# Patient Record
Sex: Male | Born: 1980 | Race: White | Hispanic: Yes | Marital: Single | Smoking: Never smoker
Health system: Southern US, Community
[De-identification: ages and names within clinical notes are randomized; demographics above are authoritative.]

## PROBLEM LIST (undated history)

## (undated) DIAGNOSIS — Z21 Asymptomatic human immunodeficiency virus [HIV] infection status: Secondary | ICD-10-CM

## (undated) DIAGNOSIS — B20 Human immunodeficiency virus [HIV] disease: Secondary | ICD-10-CM

## (undated) HISTORY — DX: Asymptomatic human immunodeficiency virus (hiv) infection status: Z21

## (undated) HISTORY — DX: Human immunodeficiency virus (HIV) disease: B20

## (undated) HISTORY — PX: HERNIA REPAIR: SHX51

---

## 2009-08-31 ENCOUNTER — Ambulatory Visit (HOSPITAL_COMMUNITY): Admission: RE | Admit: 2009-08-31 | Discharge: 2009-08-31 | Payer: Self-pay | Admitting: General Surgery

## 2010-04-24 LAB — CBC
Hemoglobin: 16.6 g/dL (ref 13.0–17.0)
MCH: 30.9 pg (ref 26.0–34.0)
MCHC: 35 g/dL (ref 30.0–36.0)
MCV: 88.2 fL (ref 78.0–100.0)

## 2010-04-24 LAB — BASIC METABOLIC PANEL
CO2: 28 mEq/L (ref 19–32)
Calcium: 10 mg/dL (ref 8.4–10.5)
Glucose, Bld: 94 mg/dL (ref 70–99)
Sodium: 140 mEq/L (ref 135–145)

## 2010-04-24 LAB — SURGICAL PCR SCREEN: Staphylococcus aureus: POSITIVE — AB

## 2011-05-03 ENCOUNTER — Other Ambulatory Visit (HOSPITAL_COMMUNITY): Payer: Self-pay | Admitting: Preventative Medicine

## 2011-05-03 ENCOUNTER — Ambulatory Visit (HOSPITAL_COMMUNITY)
Admission: RE | Admit: 2011-05-03 | Discharge: 2011-05-03 | Disposition: A | Discharge status: Home / Self Care | Payer: Self-pay | Source: Ambulatory Visit | Attending: Preventative Medicine | Admitting: Preventative Medicine

## 2011-05-03 DIAGNOSIS — R1031 Right lower quadrant pain: Secondary | ICD-10-CM | POA: Insufficient documentation

## 2011-05-03 DIAGNOSIS — R10829 Rebound abdominal tenderness, unspecified site: Secondary | ICD-10-CM

## 2011-05-03 MED ORDER — IOHEXOL 300 MG/ML  SOLN
100.0000 mL | Freq: Once | INTRAMUSCULAR | Status: AC | PRN
  Administered 2011-05-03: 100 mL via INTRAVENOUS

## 2013-03-10 IMAGING — CT CT ABD-PELV W/ CM
2 of 4 series · 17 of 46 positions shown, 19 images · IV contrast (omnipaque)
Comparison: None

CLINICAL DATA: Right lower quadrant pain.

CT ABDOMEN AND PELVIS WITH CONTRAST
TECHNIQUE: Multidetector CT imaging of the abdomen and pelvis was
performed following the standard protocol during bolus
administration of intravenous contrast.
Contrast:  100 ml Omnipaque 300 IV.

[Series 2: abd_pel_with 5.0 b40f · axial · 0.76mm/px · z∈[-488,-48]mm · 14 of 98 slices shown, 16 images]
[im 5/98  soft-tissue]
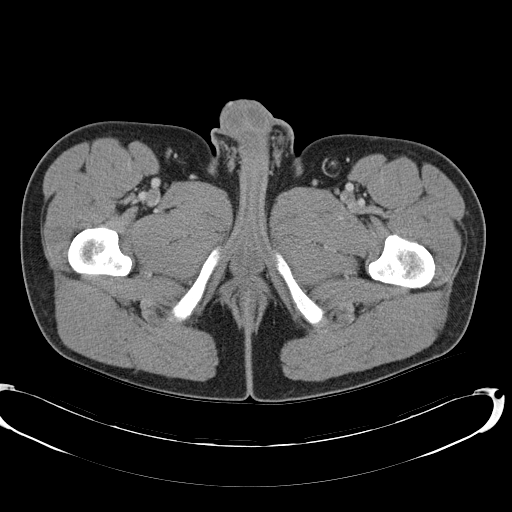
[im 5/98  bone]
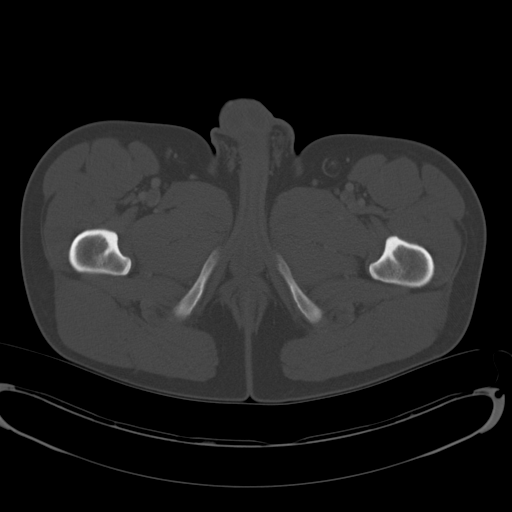
[im 13/98  soft-tissue]
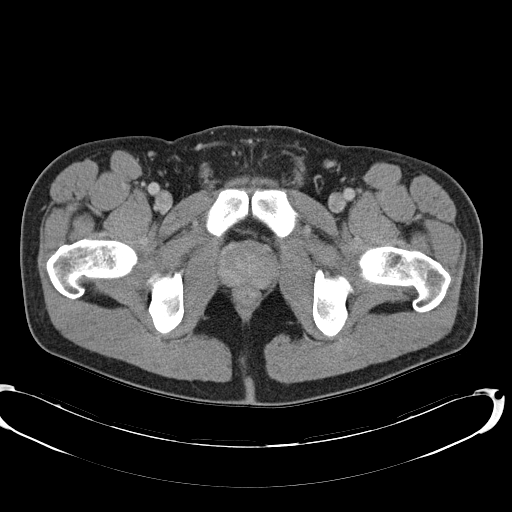
[im 21/98  soft-tissue]
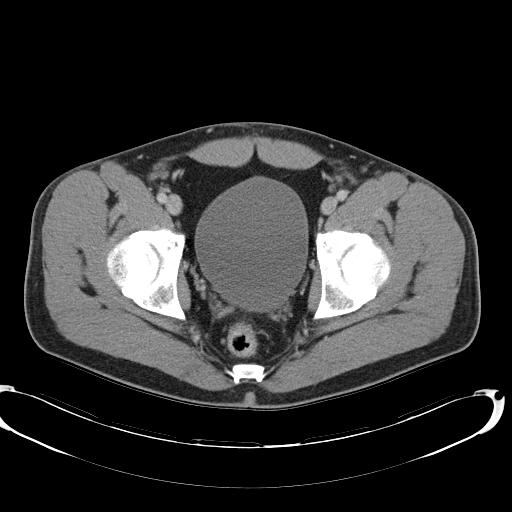
[im 25/98  soft-tissue]
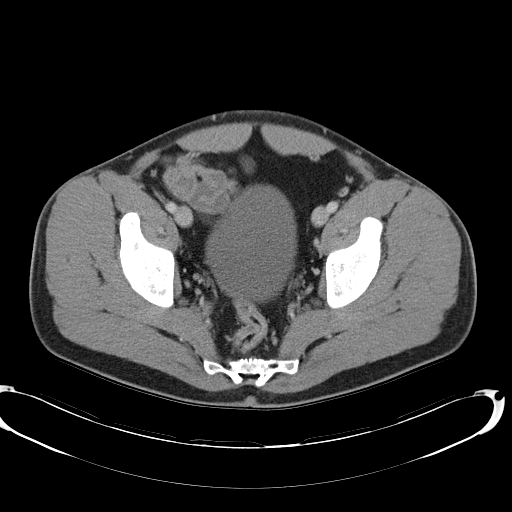
[im 33/98  soft-tissue]
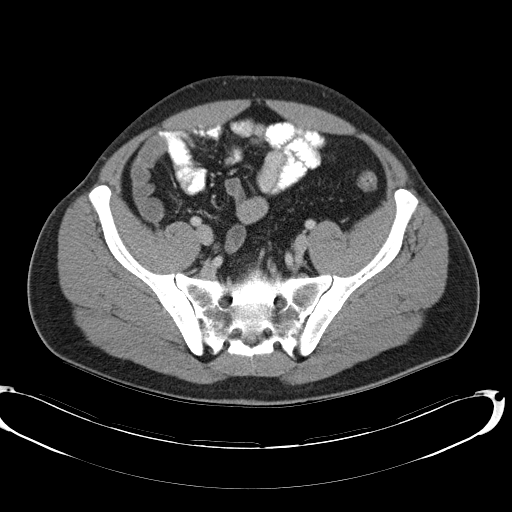
[im 41/98  soft-tissue]
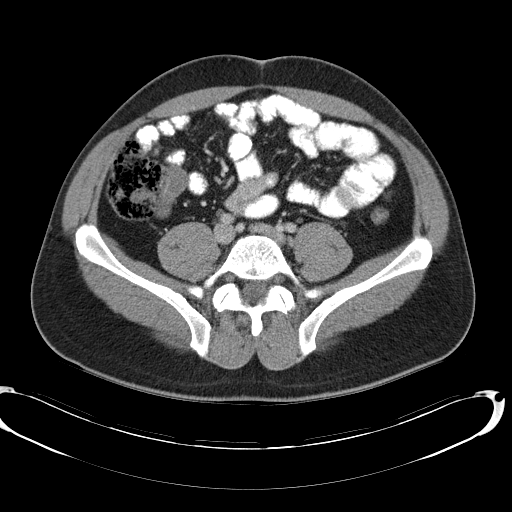
[im 45/98  soft-tissue]
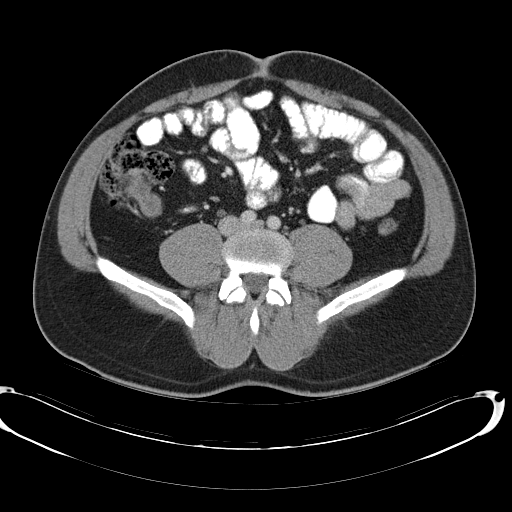
[im 53/98  soft-tissue]
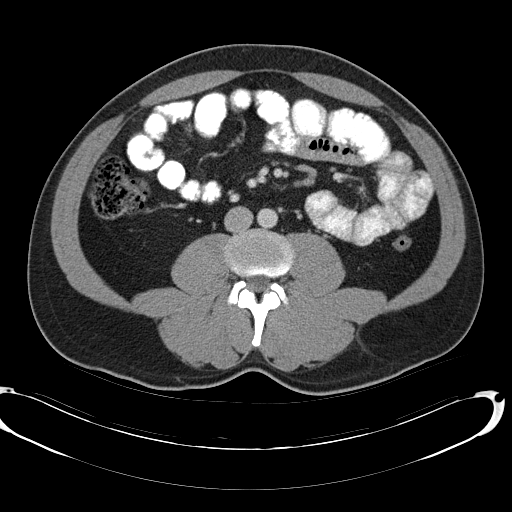
[im 57/98  soft-tissue]
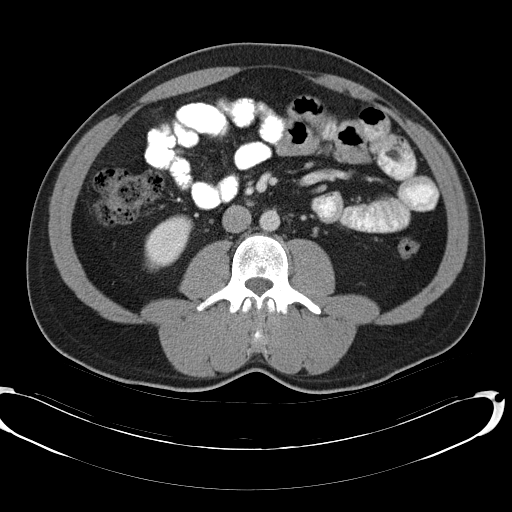
[im 57/98  bone]
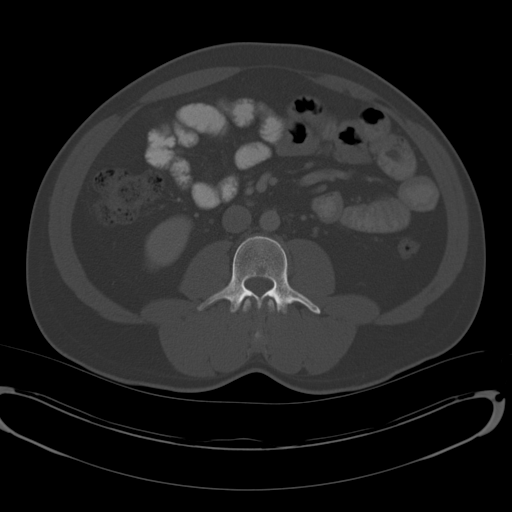
[im 65/98  soft-tissue]
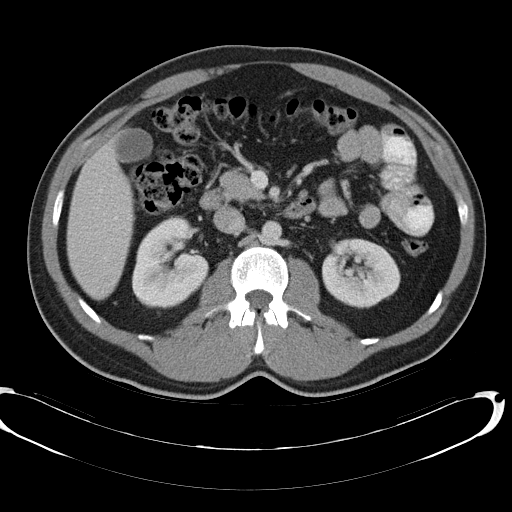
[im 73/98  soft-tissue]
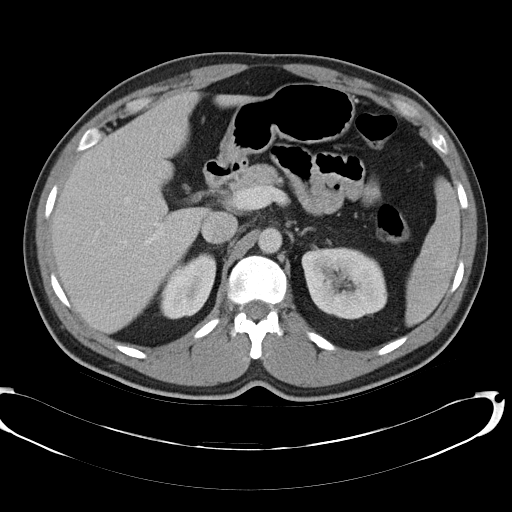
[im 77/98  soft-tissue]
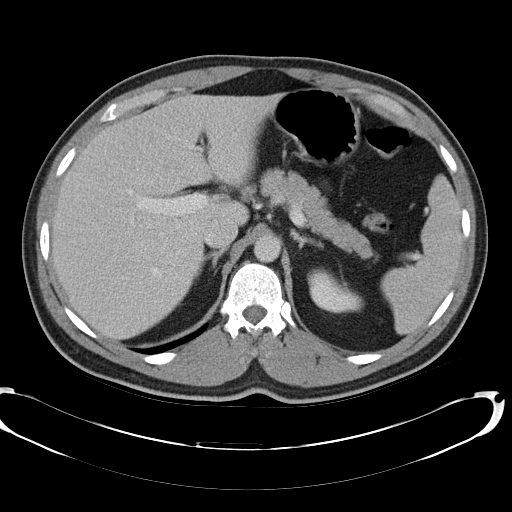
[im 85/98  soft-tissue]
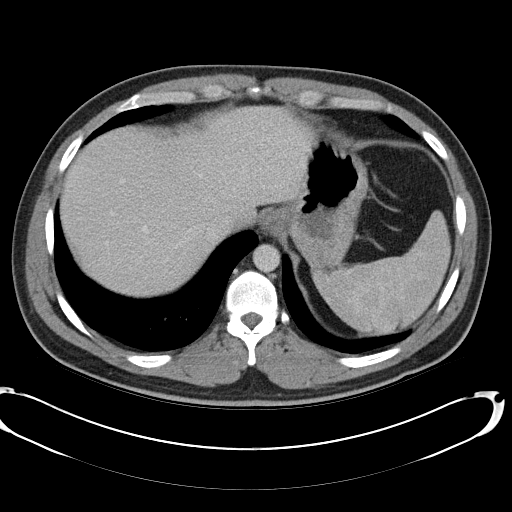
[im 93/98  soft-tissue]
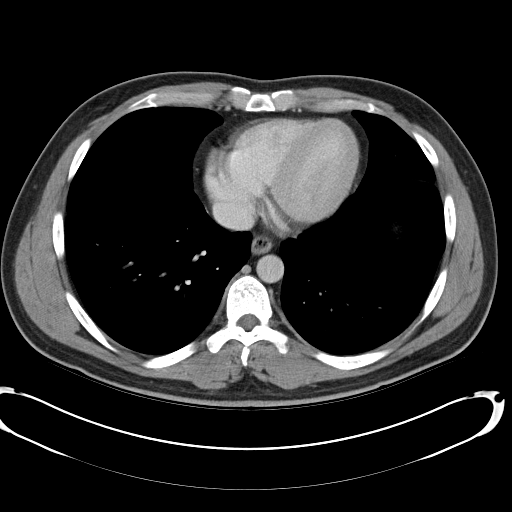

[Series 4: abd_pel_with 3.0 spo cor · coronal · 0.73mm/px · 3 of 89 slices shown]
[im 30/89  soft-tissue]
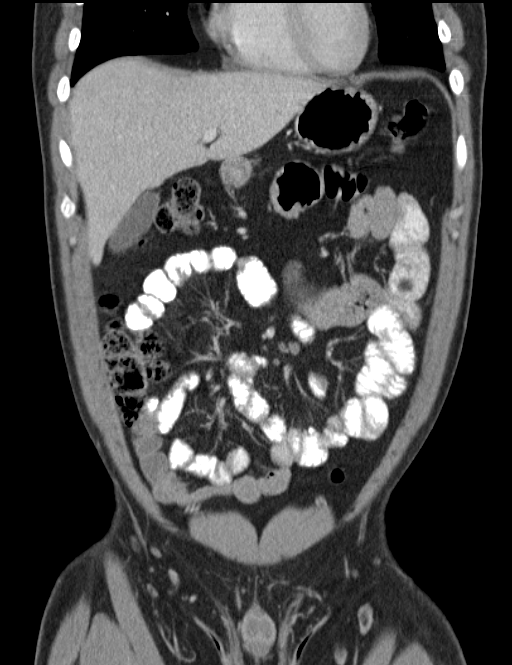
[im 40/89  soft-tissue]
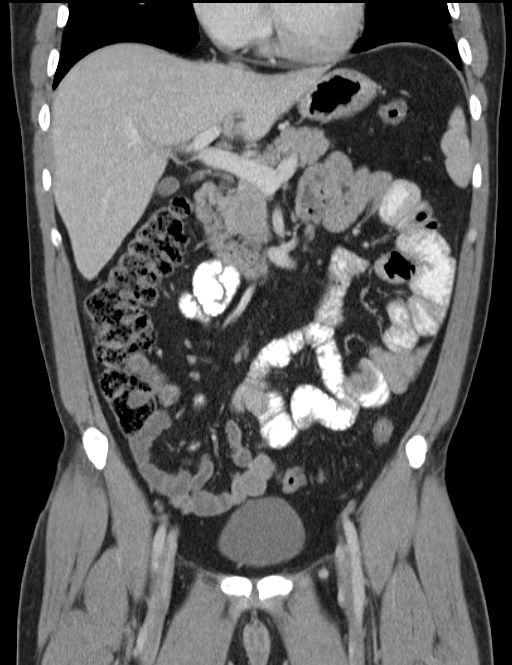
[im 49/89  soft-tissue]
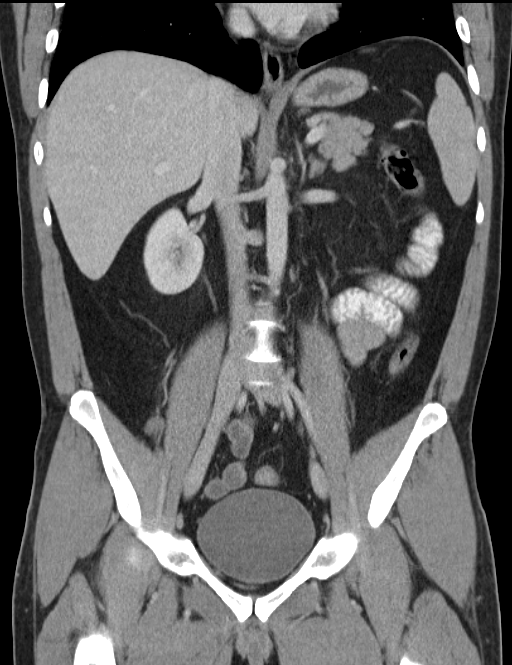

[17 of 46 positions shown; findings below may reference images not displayed]

FINDINGS: Lung bases are clear.  No effusions.  Heart is normal
size.

Liver, gallbladder, spleen, pancreas, adrenals and kidneys are
unremarkable.  Appendix is visualized and is normal. Bowel grossly
unremarkable.  No free fluid, free air, or adenopathy.  No acute
bony abnormality.  Scattered small mesenteric lymph nodes, none
pathologically enlarged.  Aorta is normal caliber.
IMPRESSION: Normal appendix.  No acute findings in the abdomen or pelvis.

## 2019-02-20 ENCOUNTER — Other Ambulatory Visit: Payer: Self-pay

## 2019-02-20 ENCOUNTER — Ambulatory Visit: Payer: Self-pay | Attending: Internal Medicine

## 2019-02-20 DIAGNOSIS — Z20822 Contact with and (suspected) exposure to covid-19: Secondary | ICD-10-CM

## 2019-02-20 DIAGNOSIS — U071 COVID-19: Secondary | ICD-10-CM | POA: Insufficient documentation

## 2019-02-21 LAB — NOVEL CORONAVIRUS, NAA: SARS-CoV-2, NAA: DETECTED — AB

## 2021-05-20 ENCOUNTER — Telehealth: Payer: Self-pay

## 2021-05-20 NOTE — Telephone Encounter (Signed)
Patient called wanting to schedule appointment. New B20 - referral coming from [facility]. Advised patient I would send a message to our referral coordinator and we'll be in contact with him to schedule appointment.  ? ? ?Clinton Baker, CMA ? ?

## 2021-05-26 ENCOUNTER — Other Ambulatory Visit (HOSPITAL_COMMUNITY): Payer: Self-pay

## 2021-05-26 ENCOUNTER — Telehealth: Payer: Self-pay

## 2021-05-26 NOTE — Telephone Encounter (Signed)
[facility] Patient Advocate Encounter ? ?Insurance verification completed.   ? ?The patient is insured through [employer]. ? ?Biktarvy $300.00 ?Dovato $0.00 ? ?We will continue to follow to see if copay assistance is needed. ? ?Ileene Patrick, CPhT ?Specialty Pharmacy Patient Advocate ?[facility] ?Phone: [phone] ?Fax:  [phone]  ?

## 2021-05-27 ENCOUNTER — Other Ambulatory Visit: Payer: Self-pay

## 2021-05-27 ENCOUNTER — Ambulatory Visit (INDEPENDENT_AMBULATORY_CARE_PROVIDER_SITE_OTHER): Payer: 59 | Admitting: Pharmacist

## 2021-05-27 ENCOUNTER — Other Ambulatory Visit: Payer: 59

## 2021-05-27 ENCOUNTER — Encounter: Payer: Self-pay | Admitting: Family

## 2021-05-27 ENCOUNTER — Ambulatory Visit (INDEPENDENT_AMBULATORY_CARE_PROVIDER_SITE_OTHER): Payer: 59 | Admitting: Family

## 2021-05-27 ENCOUNTER — Ambulatory Visit: Payer: 59

## 2021-05-27 VITALS — BP 147/96 | HR 75 | Temp 97.8°F | Wt 203.0 lb

## 2021-05-27 DIAGNOSIS — Z79899 Other long term (current) drug therapy: Secondary | ICD-10-CM

## 2021-05-27 DIAGNOSIS — Z113 Encounter for screening for infections with a predominantly sexual mode of transmission: Secondary | ICD-10-CM | POA: Diagnosis not present

## 2021-05-27 DIAGNOSIS — B20 Human immunodeficiency virus [HIV] disease: Secondary | ICD-10-CM

## 2021-05-27 MED ORDER — DOVATO 50-300 MG PO TABS: 1.0000 | ORAL_TABLET | Freq: Every day | ORAL | 3 refills | Status: DC

## 2021-05-27 NOTE — Patient Instructions (Addendum)
Nice to see you. ? ?We will check your lab work today. ? ?Start taking your medications.  ? ?Refills have been sent to the pharmacy. ? ?Plan for follow up in 1 months or sooner if needed with lab work on the same day. ? ?Have a great day and stay safe! ? ?

## 2021-05-27 NOTE — Assessment & Plan Note (Signed)
Mr. 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 Currently asymptomatic and treatment naive. We discussed the pathogenesis, risks if left untreated, transmission, treatment options, clinic resources and financial assistance programs. Check initial clinic lab work today. Start Dovato. Plan for follow up in 1 month or sooner if needed with lab work on the same day.  ?

## 2021-05-27 NOTE — Progress Notes (Signed)
? ?  05/27/2021 ? ?HPI: Clinton Baker is a 41 y.o. male who presents to the [facility] clinic today to initiate care for a newly diagnosed HIV infection. ? ?Patient Active Problem List  ? Diagnosis Date Noted  ? HIV disease ([place]) 05/27/2021  ? ? ?Patient's Medications  ? No medications on file  ? ? ?Allergies: ?No Known Allergies ? ?Past Medical History: ?Past Medical History:  ?Diagnosis Date  ? HIV infection ([place])   ? ? ?Social History: ?Social History  ? ?Socioeconomic History  ? Marital status: Single  ?  Spouse name: Not on file  ? Number of children: Not on file  ? Years of education: Not on file  ? Highest education level: Not on file  ?Occupational History  ? Not on file  ?Tobacco Use  ? Smoking status: Never  ?  Passive exposure: Never  ? Smokeless tobacco: Never  ?Vaping Use  ? Vaping Use: Never used  ?Substance and Sexual Activity  ? Alcohol use: Not on file  ?  Comment: Occasional  ? Drug use: Never  ? Sexual activity: Not on file  ?Other Topics Concern  ? Not on file  ?Social History Narrative  ? Not on file  ? ?Social Determinants of Health  ? ?Financial Resource Strain: Not on file  ?Food Insecurity: Not on file  ?Transportation Needs: Not on file  ?Physical Activity: Not on file  ?Stress: Not on file  ?Social Connections: Not on file  ? ? ?Labs: ?No results found for: HIV1RNAQUANT, HIV1RNAVL, CD4TABS ? ?RPR and STI ?No results found for: LABRPR, RPRTITER ? ?   ? View : No data to display.  ?  ?  ?  ? ? ?Hepatitis B ?No results found for: HEPBSAB, HEPBSAG, HEPBCAB ?Hepatitis C ?No results found for: [place], HCVRNAPCRQN ?Hepatitis A ?No results found for: HAV ?Lipids: ?No results found for: CHOL, TRIG, HDL, CHOLHDL, VLDL, LDLCALC ? ?Current HIV Regimen: ?Treatment naive ? ?Assessment: ?Clinton Baker is here today to initiate care with Marya Amsler for newly diagnosed HIV infection. Clinton Baker is treatment naive. Marya Amsler will start patient on Dovato. ? ?Plan: ?Explained that Dovato is a one pill once daily medication with  or without food and the importance of not missing any doses. Explained resistance and how it develops and why it is so important to take Dovato daily and not skip days or doses. Counseled patient to take it around the same time each day. Cautioned on possible side effects the first week or so including nausea, diarrhea, dizziness, and headaches but that they should resolve after the first couple of weeks. I reviewed patient medications and found no drug interactions. Counseled patient to separate Dovato from divalent cations including multivitamins. Discussed with patient to call clinic if he starts a new medication or herbal supplement.  Gave the patient my card and told him to call with any issues/questions/concerns.  ? ?Patient prefers prescription sent to [facility] in [place]. Declines mail order services at this time. Offered/Declined pillbox.  ? ?Marlowe Alt, PharmD Candidate ?05/27/2021 9:52 AM ? ? ?

## 2021-05-27 NOTE — Progress Notes (Signed)
? ? ?Brief Narrative  ? ?Patient ID: Clinton Baker, male    DOB: 04-Jun-1980, 41 y.o.   MRN: [id number] ? ?Clinton Baker is a 41 y/o hispanic male diagnosed with HIV disease on 04/22/21 with risk factor of MSM. Initial CD4, viral load and genotype pending. No history of opportunistic infection. Started on Dovato.  ? ?Subjective:  ?  ?Chief Complaint  ?Patient presents with  ?? HIV Positive/AIDS  ?? New Patient (Initial Visit)  ? ? ?HPI: ? ?Clinton Baker is a 41 y.o. male with no significant previous medical history presenting today for initial HIV visit.  ? ?Clinton Baker was recently evaluated in the [facility] Department for STD screen and was noted to have a positive HIV test with confirmatory HIV-1 antibody test. Risk factor is MSM. Last negative HIV test was about 1 year ago in March 2022. No signs/symptoms of opportunistic infection. Currently working [employer]. Housing is stable and good access to food. Has not told his partner yet who is HIV negative and has not been sexually active with him in several years. ? ?Clinton Baker is insured through [employer]. Denies feelings of being down, depressed or hopeless. Has researched new diagnosis. Drinks alcohol on occasion with no recreational or illicit drug use or tobacco use. Condoms offered.  ? ? ?No Known Allergies ? ? ? ?No outpatient medications prior to visit.  ? ?No facility-administered medications prior to visit.  ? ? ? ?Past Medical History:  ?Diagnosis Date  ?? HIV infection ([place])   ? ? ? ?Past Surgical History:  ?Procedure Laterality Date  ?? HERNIA REPAIR    ? ? ? ? ?Review of Systems  ?Constitutional:  Negative for appetite change, chills, fatigue, fever and unexpected weight change.  ?Eyes:  Negative for visual disturbance.  ?Respiratory:  Negative for cough, chest tightness, shortness of breath and wheezing.   ?Cardiovascular:  Negative for chest pain and leg swelling.  ?Gastrointestinal:  Negative for abdominal pain, constipation, diarrhea, nausea and  vomiting.  ?Genitourinary:  Negative for dysuria, flank pain, frequency, genital sores, hematuria and urgency.  ?Skin:  Negative for rash.  ?Allergic/Immunologic: Negative for immunocompromised state.  ?Neurological:  Negative for dizziness and headaches.  ?   ?Objective:  ?  ?BP (!) 147/96   Pulse 75   Temp 97.8 ?F (36.6 ?C) (Temporal)   Wt 203 lb (92.1 kg)   SpO2 99%  ?Nursing note and vital signs reviewed. ? ?Physical Exam ?Constitutional:   ?   General: He is not in acute distress. ?   Appearance: He is well-developed.  ?Eyes:  ?   Conjunctiva/sclera: Conjunctivae normal.  ?Cardiovascular:  ?   Rate and Rhythm: Normal rate and regular rhythm.  ?   Heart sounds: Normal heart sounds. No murmur heard. ?  No friction rub. No gallop.  ?Pulmonary:  ?   Effort: Pulmonary effort is normal. No respiratory distress.  ?   Breath sounds: Normal breath sounds. No wheezing or rales.  ?Chest:  ?   Chest wall: No tenderness.  ?Abdominal:  ?   General: Bowel sounds are normal.  ?   Palpations: Abdomen is soft.  ?   Tenderness: There is no abdominal tenderness.  ?Musculoskeletal:  ?   Cervical back: Neck supple.  ?Lymphadenopathy:  ?   Cervical: No cervical adenopathy.  ?Skin: ?   General: Skin is warm and dry.  ?   Findings: No rash.  ?Neurological:  ?   Mental Status: He is alert and oriented to  person, place, and time.  ?Psychiatric:     ?   Behavior: Behavior normal.     ?   Thought Content: Thought content normal.     ?   Judgment: Judgment normal.  ? ? ? ? ?  05/27/2021  ?  8:58 AM  ?Depression screen PHQ 2/9  ?Decreased Interest 0  ?Down, Depressed, Hopeless 0  ?PHQ - 2 Score 0  ?  ?   ?Assessment & Plan:  ? ? ?Patient Active Problem List  ? Diagnosis Date Noted  ?? HIV disease ([place]) 05/27/2021  ? ? ? ?Problem List Items Addressed This Visit   ? ?  ? Other  ? HIV disease ([place]) - Primary  ?  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 Currently asymptomatic and treatment  naive. We discussed the pathogenesis, risks if left untreated, transmission, treatment options, clinic resources and financial assistance programs. Check initial clinic lab work today. Start Dovato. Plan for follow up in 1 month or sooner if needed with lab work on the same day.  ? ?  ?  ? Relevant Medications  ? dolutegravir-lamiVUDine (DOVATO) 50-300 MG tablet  ? Other Relevant Orders  ? CBC WITH DIFFERENTIAL/PLATELET  ? COMPLETE METABOLIC PANEL WITH GFR  ? HEPATITIS B SURFACE ANTIGEN  ? HEPATITIS B SURFACE ANTIBODY  ? HEPATITIS B CORE ANTIBODY, TOTAL  ? HEPATITIS C ANTIBODY  ? HEPATITIS A ANTIBODY, TOTAL  ? URINALYSIS  ? HIV-1 RNA ULTRAQUANT REFLEX TO GENTYP+  ? HIV antibody  ? HLA [id number]  ? QuantiFERON-TB Gold Plus  ? T-helper cell (CD4)- ([facility] clinic only)  ? ?Other Visit Diagnoses   ? ? Screening for STDs (sexually transmitted diseases)      ? Relevant Orders  ? RPR  ? Pharmacologic therapy      ? Relevant Orders  ? LIPID PANEL  ? ?  ? ? ? ?I am having Clinton Baker start on Dovato. ? ? ?Meds ordered this encounter  ?Medications  ?? dolutegravir-lamiVUDine (DOVATO) 50-300 MG tablet  ?  Sig: Take 1 tablet by mouth daily.  ?  Dispense:  30 tablet  ?  Refill:  3  ?  Order Specific Question:   Supervising Provider  ?  Answer:   Carlyle Basques [[id number]]  ? ? ? ?Follow-up: Return in about 1 month (around 06/26/2021), or if symptoms worsen or fail to improve. ? ? ?Terri Piedra, MSN, FNP-C ?Nurse Practitioner ?[place] [facility] ?[facility] ?[facility] Main number: [phone] ? ? ?

## 2021-05-28 LAB — URINALYSIS
Bilirubin Urine: NEGATIVE
Glucose, UA: NEGATIVE
Hgb urine dipstick: NEGATIVE
Ketones, ur: NEGATIVE
Leukocytes,Ua: NEGATIVE
Nitrite: NEGATIVE
Protein, ur: NEGATIVE
Specific Gravity, Urine: 1.02 (ref 1.001–1.035)
pH: 5.5 (ref 5.0–8.0)

## 2021-06-08 ENCOUNTER — Encounter: Payer: Self-pay | Admitting: Infectious Disease

## 2021-06-09 LAB — CBC WITH DIFFERENTIAL/PLATELET
Absolute Monocytes: 262 cells/uL (ref 200–950)
Basophils Absolute: 30 cells/uL (ref 0–200)
Basophils Relative: 0.8 %
Eosinophils Absolute: 42 cells/uL (ref 15–500)
Eosinophils Relative: 1.1 %
HCT: 47.5 % (ref 38.5–50.0)
Hemoglobin: 16.2 g/dL (ref 13.2–17.1)
Lymphs Abs: 741 cells/uL — ABNORMAL LOW (ref 850–3900)
MCH: 29.5 pg (ref 27.0–33.0)
MCHC: 34.1 g/dL (ref 32.0–36.0)
MCV: 86.5 fL (ref 80.0–100.0)
MPV: 9.7 fL (ref 7.5–12.5)
Monocytes Relative: 6.9 %
Neutro Abs: 2725 cells/uL (ref 1500–7800)
Neutrophils Relative %: 71.7 %
Platelets: 201 10*3/uL (ref 140–400)
RBC: 5.49 10*6/uL (ref 4.20–5.80)
RDW: 13.5 % (ref 11.0–15.0)
Total Lymphocyte: 19.5 %
WBC: 3.8 10*3/uL (ref 3.8–10.8)

## 2021-06-09 LAB — HIV-1/2 AB - DIFFERENTIATION
HIV-1 antibody: POSITIVE — AB
HIV-2 Ab: NEGATIVE

## 2021-06-09 LAB — QUANTIFERON-TB GOLD PLUS
Mitogen-NIL: >10 IU/mL
NIL: 0.03 IU/mL
QuantiFERON-TB Gold Plus: NEGATIVE
TB1-NIL: 0.03 IU/mL
TB2-NIL: 0.04 IU/mL

## 2021-06-09 LAB — HIV-1 RNA ULTRAQUANT REFLEX TO GENTYP+
HIV 1 RNA Quant: 39700 copies/mL — ABNORMAL HIGH
HIV-1 RNA Quant, Log: 4.6 Log copies/mL — ABNORMAL HIGH

## 2021-06-09 LAB — COMPLETE METABOLIC PANEL WITH GFR
AG Ratio: 1.8 (calc) (ref 1.0–2.5)
ALT: 53 U/L — ABNORMAL HIGH (ref 9–46)
AST: 28 U/L (ref 10–40)
Albumin: 5 g/dL (ref 3.6–5.1)
Alkaline phosphatase (APISO): 96 U/L (ref 36–130)
BUN: 18 mg/dL (ref 7–25)
CO2: 26 mmol/L (ref 20–32)
Calcium: 9.9 mg/dL (ref 8.6–10.3)
Chloride: 103 mmol/L (ref 98–110)
Creat: 0.89 mg/dL (ref 0.60–1.29)
Globulin: 2.8 g/dL (calc) (ref 1.9–3.7)
Glucose, Bld: 110 mg/dL — ABNORMAL HIGH (ref 65–99)
Potassium: 4.2 mmol/L (ref 3.5–5.3)
Sodium: 137 mmol/L (ref 135–146)
Total Bilirubin: 0.7 mg/dL (ref 0.2–1.2)
Total Protein: 7.8 g/dL (ref 6.1–8.1)
eGFR: 110 mL/min/{1.73_m2} (ref >=60)

## 2021-06-09 LAB — RPR: RPR Ser Ql: NONREACTIVE

## 2021-06-09 LAB — HEPATITIS C ANTIBODY
Hepatitis C Ab: NONREACTIVE
SIGNAL TO CUT-OFF: 0.18 (ref <1.00)

## 2021-06-09 LAB — HEPATITIS B CORE ANTIBODY, TOTAL: Hep B Core Total Ab: NONREACTIVE

## 2021-06-09 LAB — HLA B*5701: HLA-B*5701 w/rflx HLA-B High: NEGATIVE

## 2021-06-09 LAB — HEPATITIS B SURFACE ANTIBODY,QUALITATIVE: Hep B S Ab: NONREACTIVE

## 2021-06-09 LAB — LIPID PANEL
Cholesterol: 178 mg/dL (ref <200)
HDL: 40 mg/dL (ref >=40)
LDL Cholesterol (Calc): 117 mg/dL (calc) — ABNORMAL HIGH
Non-HDL Cholesterol (Calc): 138 mg/dL (calc) — ABNORMAL HIGH (ref <130)
Total CHOL/HDL Ratio: 4.5 (calc) (ref <5.0)
Triglycerides: 104 mg/dL (ref <150)

## 2021-06-09 LAB — HEPATITIS B SURFACE ANTIGEN: Hepatitis B Surface Ag: NONREACTIVE

## 2021-06-09 LAB — HIV ANTIBODY (ROUTINE TESTING W REFLEX): HIV 1&2 Ab, 4th Generation: REACTIVE — AB

## 2021-06-09 LAB — HIV-1 GENOTYPE: HIV-1 Genotype: DETECTED — AB

## 2021-06-09 LAB — HEPATITIS A ANTIBODY, TOTAL: Hepatitis A AB,Total: REACTIVE — AB

## 2021-06-10 ENCOUNTER — Encounter: Payer: 59 | Admitting: Infectious Disease

## 2021-06-10 ENCOUNTER — Ambulatory Visit: Payer: 59 | Admitting: Pharmacist

## 2021-06-25 ENCOUNTER — Ambulatory Visit: Payer: 59 | Admitting: Family

## 2021-06-25 ENCOUNTER — Other Ambulatory Visit: Payer: Self-pay

## 2021-06-25 ENCOUNTER — Encounter: Payer: Self-pay | Admitting: Family

## 2021-06-25 VITALS — BP 119/82 | HR 76 | Temp 98.3°F | Ht 74.0 in | Wt 205.0 lb

## 2021-06-25 DIAGNOSIS — B20 Human immunodeficiency virus [HIV] disease: Secondary | ICD-10-CM | POA: Diagnosis not present

## 2021-06-25 DIAGNOSIS — Z0000 Encounter for general adult medical examination without abnormal findings: Secondary | ICD-10-CM | POA: Diagnosis not present

## 2021-06-25 NOTE — Assessment & Plan Note (Signed)
Mr. Tomko has good adherence and tolerance following 1 month of Dovato.  We reviewed initial lab work and discussed plan of care.  Check blood work today.  Reviewed undetectable equals un transmittable.  Continue current dose of Dovato.  Plan for follow-up in 2 months or sooner if needed with lab work on the same day.

## 2021-06-25 NOTE — Assessment & Plan Note (Signed)
   Discussed importance of safe sexual practices and condom use.  Condoms offered.  Declines vaccines and is due for meningococcal, hepatitis B, and pneumococcal vaccinations.  Encouraged to complete routine dental care independently with commercial insurance and if unable can refer to [facility] clinic.

## 2021-06-25 NOTE — Patient Instructions (Signed)
Nice to see you.  We will check your lab work today.  Continue to take your medication daily as prescribed.  Plan for follow up in 1 months or sooner if needed with lab work on the same day.  Have a great day and stay safe!  

## 2021-06-25 NOTE — Progress Notes (Signed)
Brief Narrative   Patient ID: Clinton Baker, male    DOB: 25-Aug-1980, 41 y.o.   MRN: [id number]  Clinton Baker is a 41 y/o hispanic male diagnosed with HIV disease on 04/22/21 with risk factor of MSM.  Initial CD4 count unable to be completed secondary to laboratory error with initial viral load of 39,700.  Genotype with no significant medication resistance patterns.  No history of opportunistic infection.  HLA B5 701 negative.  Medication nave at start of treatment with Dovato.   Subjective:    Chief Complaint  Patient presents with   Follow-up    HPI:  Clinton Baker is a 41 y.o. male with HIV disease last seen on 05/27/2021 for initial office visit following new diagnosis and started on Dovato.  Initial lab work completed with viral load of 39,700.  CD4 count unable to be completed secondary to laboratory error.  Genotype with no significant resistance patterns.  Immune to hepatitis A.  No infection with hepatitis B or hepatitis C.  Not immune to hepatitis B.  HLA B5 701 and QuantiFERON gold negative.  Kidney function and electrolytes within normal ranges.  Liver function tests mildly elevated.  Here today for 1 month follow-up.  Clinton Baker has been taking his Dovato daily with no adverse side effects.  Feeling well today with no new concerns/complaints.  He is working on trying to inform his partner and does not wish to inform his family at this time. Denies fevers, chills, night sweats, headaches, changes in vision, neck pain/stiffness, nausea, diarrhea, vomiting, lesions or rashes.  Clinton Baker has no problems obtaining medication from the pharmacy and remains covered by [place] healthcare.  Denies feelings of being down, depressed, or hopeless recently.  Condoms offered.  Healthcare maintenance due includes hepatitis B, pneumococcal, and meningococcal vaccinations.   No Known Allergies    Outpatient Medications Prior to Visit  Medication Sig Dispense Refill    dolutegravir-lamiVUDine (DOVATO) 50-300 MG tablet Take 1 tablet by mouth daily. 30 tablet 3   No facility-administered medications prior to visit.     Past Medical History:  Diagnosis Date   HIV infection ([place])      Past Surgical History:  Procedure Laterality Date   HERNIA REPAIR        Review of Systems  Constitutional:  Negative for appetite change, chills, fatigue, fever and unexpected weight change.  Eyes:  Negative for visual disturbance.  Respiratory:  Negative for cough, chest tightness, shortness of breath and wheezing.   Cardiovascular:  Negative for chest pain and leg swelling.  Gastrointestinal:  Negative for abdominal pain, constipation, diarrhea, nausea and vomiting.  Genitourinary:  Negative for dysuria, flank pain, frequency, genital sores, hematuria and urgency.  Skin:  Negative for rash.  Allergic/Immunologic: Negative for immunocompromised state.  Neurological:  Negative for dizziness and headaches.     Objective:    BP 119/82   Pulse 76   Temp 98.3 F (36.8 C) (Oral)   Ht _0  (1.88 m)   Wt 205 lb (93 kg)   SpO2 100%   BMI 26.32 kg/m  Nursing note and vital signs reviewed.  Physical Exam Constitutional:      General: He is not in acute distress.    Appearance: He is well-developed.  Eyes:     Conjunctiva/sclera: Conjunctivae normal.  Cardiovascular:     Rate and Rhythm: Normal rate and regular rhythm.     Heart sounds: Normal heart sounds. No murmur heard.   No friction  rub. No gallop.  Pulmonary:     Effort: Pulmonary effort is normal. No respiratory distress.     Breath sounds: Normal breath sounds. No wheezing or rales.  Chest:     Chest wall: No tenderness.  Abdominal:     General: Bowel sounds are normal.     Palpations: Abdomen is soft.     Tenderness: There is no abdominal tenderness.  Musculoskeletal:     Cervical back: Neck supple.  Lymphadenopathy:     Cervical: No cervical adenopathy.  Skin:    General: Skin is warm  and dry.     Findings: No rash.  Neurological:     Mental Status: He is alert and oriented to person, place, and time.  Psychiatric:        Behavior: Behavior normal.        Thought Content: Thought content normal.        Judgment: Judgment normal.        06/25/2021    9:36 AM 05/27/2021    8:58 AM  Depression screen PHQ 2/9  Decreased Interest 0 0  Down, Depressed, Hopeless 0 0  PHQ - 2 Score 0 0       Assessment & Plan:    Patient Active Problem List   Diagnosis Date Noted   Healthcare maintenance 06/25/2021   HIV disease ([place]) 05/27/2021     Problem List Items Addressed This Visit       Other   HIV disease ([place]) - Primary    Clinton Baker has good adherence and tolerance following 1 month of Dovato.  We reviewed initial lab work and discussed plan of care.  Check blood work today.  Reviewed undetectable equals un transmittable.  Continue current dose of Dovato.  Plan for follow-up in 2 months or sooner if needed with lab work on the same day.       Relevant Orders   HIV-1 RNA quant-no reflex-bld   T-helper cells (CD4) count (not at [facility])   Healthcare maintenance    Discussed importance of safe sexual practices and condom use.  Condoms offered. Declines vaccines and is due for meningococcal, hepatitis B, and pneumococcal vaccinations. Encouraged to complete routine dental care independently with commercial insurance and if unable can refer to [facility] clinic.         I am having Clinton Baker maintain his Dovato.   No orders of the defined types were placed in this encounter.    Follow-up: Return in about 2 months (around 08/25/2021), or if symptoms worsen or fail to improve.   Terri Piedra, MSN, FNP-C Nurse Practitioner [facility] for [facility] [place] number: [phone]

## 2021-06-29 LAB — T-HELPER CELLS (CD4) COUNT (NOT AT ARMC)
Absolute CD4: 356 cells/uL — ABNORMAL LOW (ref 490–1740)
CD4 T Helper %: 27 % — ABNORMAL LOW (ref 30–61)
Total lymphocyte count: 1307 cells/uL (ref 850–3900)

## 2021-06-29 LAB — HIV-1 RNA QUANT-NO REFLEX-BLD
HIV 1 RNA Quant: 48 copies/mL — ABNORMAL HIGH
HIV-1 RNA Quant, Log: 1.68 Log copies/mL — ABNORMAL HIGH

## 2021-08-24 ENCOUNTER — Other Ambulatory Visit: Payer: Self-pay

## 2021-08-24 ENCOUNTER — Ambulatory Visit: Payer: 59 | Admitting: Family

## 2021-08-24 ENCOUNTER — Encounter: Payer: Self-pay | Admitting: Family

## 2021-08-24 VITALS — BP 147/87 | HR 76 | Temp 98.5°F | Ht 74.0 in | Wt 212.0 lb

## 2021-08-24 DIAGNOSIS — Z0000 Encounter for general adult medical examination without abnormal findings: Secondary | ICD-10-CM | POA: Diagnosis not present

## 2021-08-24 DIAGNOSIS — B20 Human immunodeficiency virus [HIV] disease: Secondary | ICD-10-CM | POA: Diagnosis not present

## 2021-08-24 DIAGNOSIS — Z113 Encounter for screening for infections with a predominantly sexual mode of transmission: Secondary | ICD-10-CM

## 2021-08-24 MED ORDER — DOVATO 50-300 MG PO TABS: 1.0000 | ORAL_TABLET | Freq: Every day | ORAL | 3 refills | Status: DC

## 2021-08-24 NOTE — Assessment & Plan Note (Signed)
   Discussed importance of safe sexual practices and condom use.  Reviewed family-planning.  Condoms offered.  Declines vaccinations.  Encouraged to complete routine dental care independently and can refer to [facility] if needed.

## 2021-08-24 NOTE — Patient Instructions (Addendum)
Nice to see you.  We will check your lab work today.  Continue to take your medication daily as prescribed.  Refills have been sent to the pharmacy.  Plan for follow up in 3 months or sooner if needed with lab work 1-2 weeks prior to appointment.   Have a great day and stay safe!  

## 2021-08-24 NOTE — Assessment & Plan Note (Signed)
Mr. Schnapp has well-controlled virus with good adherence and tolerance to Dovato.  Reviewed previous lab work and discussed plan of care.  Discussed family-planning options as well as undetectable being un- transmittable.  Check lab work today.  Continue current dose of Dovato.  Plan for follow-up in 3 months or sooner if needed with lab work 1 to 2 weeks prior to appointment.

## 2021-08-24 NOTE — Progress Notes (Signed)
Brief Narrative   Patient ID: Clinton Baker, male    DOB: 07/09/1980, 41 y.o.   MRN: [id number]  Clinton Baker is a 41 y/o hispanic male diagnosed with HIV disease on 04/22/21 with risk factor of MSM.  Initial CD4 count unable to be completed secondary to laboratory error with initial viral load of 39,700.  Genotype with no significant medication resistance patterns.  No history of opportunistic infection.  HLA B5 701 negative.  Medication nave at start of treatment with Dovato  Subjective:    Chief Complaint  Patient presents with   Follow-up    HPI:  Clinton Baker is a 41 y.o. male with HIV disease last seen on 06/25/2021 with well-controlled virus and good adherence and tolerance to Dovato. Viral load was undetectable at 48 and CD4 count 356. Here today for routine follow up.  Clinton Baker has been taking his Dovato daily as prescribed with no adverse side effects.  Overall feeling well today with no new concerns/complaints. Denies fevers, chills, night sweats, headaches, changes in vision, neck pain/stiffness, nausea, diarrhea, vomiting, lesions or rashes.  No problems obtaining medications from the pharmacy.  Denies feelings of being down, depressed, or hopeless.  Still working on timing of when to tell his partner about his positive status.  Partner is currently HIV negative.  Drinks alcohol on occasion with no recreational/illicit drug use or tobacco use.  Condoms offered.  Declines vaccinations.   No Known Allergies    Outpatient Medications Prior to Visit  Medication Sig Dispense Refill   dolutegravir-lamiVUDine (DOVATO) 50-300 MG tablet Take 1 tablet by mouth daily. 30 tablet 3   No facility-administered medications prior to visit.     Past Medical History:  Diagnosis Date   HIV infection ([place])      Past Surgical History:  Procedure Laterality Date   HERNIA REPAIR        Review of Systems  Constitutional:  Negative for appetite change, chills, fatigue,  fever and unexpected weight change.  Eyes:  Negative for visual disturbance.  Respiratory:  Negative for cough, chest tightness, shortness of breath and wheezing.   Cardiovascular:  Negative for chest pain and leg swelling.  Gastrointestinal:  Negative for abdominal pain, constipation, diarrhea, nausea and vomiting.  Genitourinary:  Negative for dysuria, flank pain, frequency, genital sores, hematuria and urgency.  Skin:  Negative for rash.  Allergic/Immunologic: Negative for immunocompromised state.  Neurological:  Negative for dizziness and headaches.      Objective:    BP (!) 147/87   Pulse 76   Temp 98.5 F (36.9 C) (Oral)   Ht 6' 2"  (1.88 m)   Wt 212 lb (96.2 kg)   SpO2 97%   BMI 27.22 kg/m  Nursing note and vital signs reviewed.  Physical Exam Constitutional:      General: He is not in acute distress.    Appearance: He is well-developed.  Eyes:     Conjunctiva/sclera: Conjunctivae normal.  Cardiovascular:     Rate and Rhythm: Normal rate and regular rhythm.     Heart sounds: Normal heart sounds. No murmur heard.    No friction rub. No gallop.  Pulmonary:     Effort: Pulmonary effort is normal. No respiratory distress.     Breath sounds: Normal breath sounds. No wheezing or rales.  Chest:     Chest wall: No tenderness.  Abdominal:     General: Bowel sounds are normal.     Palpations: Abdomen is soft.  Tenderness: There is no abdominal tenderness.  Musculoskeletal:     Cervical back: Neck supple.  Lymphadenopathy:     Cervical: No cervical adenopathy.  Skin:    General: Skin is warm and dry.     Findings: No rash.  Neurological:     Mental Status: He is alert and oriented to person, place, and time.  Psychiatric:        Behavior: Behavior normal.        Thought Content: Thought content normal.        Judgment: Judgment normal.         08/24/2021    9:43 AM 06/25/2021    9:36 AM 05/27/2021    8:58 AM  Depression screen PHQ 2/9  Decreased Interest 0  0 0  Down, Depressed, Hopeless 0 0 0  PHQ - 2 Score 0 0 0       Assessment & Plan:    Patient Active Problem List   Diagnosis Date Noted   Healthcare maintenance 06/25/2021   HIV disease ([place]) 05/27/2021     Problem List Items Addressed This Visit       Other   HIV disease ([place]) - Primary    Clinton Baker has well-controlled virus with good adherence and tolerance to Dovato.  Reviewed previous lab work and discussed plan of care.  Discussed family-planning options as well as undetectable being un- transmittable.  Check lab work today.  Continue current dose of Dovato.  Plan for follow-up in 3 months or sooner if needed with lab work 1 to 2 weeks prior to appointment.      Relevant Medications   dolutegravir-lamiVUDine (DOVATO) 50-300 MG tablet   Other Relevant Orders   Comprehensive metabolic panel   HIV-1 RNA quant-no reflex-bld   T-helper cells (CD4) count (not at [facility])   Comprehensive metabolic panel   HIV-1 RNA quant-no reflex-bld   T-helper cell (CD4)- ([facility] clinic only)   Healthcare maintenance    Discussed importance of safe sexual practices and condom use.  Reviewed family-planning.  Condoms offered. Declines vaccinations. Encouraged to complete routine dental care independently and can refer to [facility] if needed.       Other Visit Diagnoses     Screening for STDs (sexually transmitted diseases)       Relevant Orders   RPR        I am having Clinton Baker maintain his Dovato.   Meds ordered this encounter  Medications   dolutegravir-lamiVUDine (DOVATO) 50-300 MG tablet    Sig: Take 1 tablet by mouth daily.    Dispense:  30 tablet    Refill:  3    Order Specific Question:   Supervising Provider    Answer:   Carlyle Basques [[id number]]     Follow-up: Return in about 3 months (around 11/24/2021).   Terri Piedra, MSN, FNP-C Nurse Practitioner [facility] for Infectious Disease [place] number: [phone]

## 2021-08-25 LAB — T-HELPER CELLS (CD4) COUNT (NOT AT ARMC)
CD4 % Helper T Cell: 28 % — ABNORMAL LOW (ref 33–65)
CD4 T Cell Abs: 269 /uL — ABNORMAL LOW (ref 400–1790)

## 2021-08-26 ENCOUNTER — Telehealth: Payer: Self-pay

## 2021-08-26 ENCOUNTER — Other Ambulatory Visit: Payer: Self-pay

## 2021-08-26 ENCOUNTER — Ambulatory Visit (INDEPENDENT_AMBULATORY_CARE_PROVIDER_SITE_OTHER): Payer: 59

## 2021-08-26 DIAGNOSIS — A539 Syphilis, unspecified: Secondary | ICD-10-CM

## 2021-08-26 MED ORDER — PENICILLIN G BENZATHINE 1200000 UNIT/2ML IM SUSY
1.2000 10*6.[IU] | PREFILLED_SYRINGE | Freq: Once | INTRAMUSCULAR | Status: AC
  Administered 2021-08-26: 1.2 10*6.[IU] via INTRAMUSCULAR

## 2021-08-26 NOTE — Telephone Encounter (Signed)
Patient returned call regarding results. Confirmed patient with two identifiers before disclosing results. Is scheduled to come in today for Bicillin. Denies allergies to medication. Has not been treated for syphillis in the past.  Understands he must inform recent partners to get tested/ treated at health department or STD clinic. Encouraged patient to use condoms for all sexual encounters to avoid future STDs.  Does not have any questions at this time.  Juanita Laster, RMA

## 2021-08-26 NOTE — Telephone Encounter (Signed)
Received call today from [facility] regarding patient positive RPR. Would like to know if patient has any history of syphillis and if he was symptomatic at appointment.  Note does not mention patient being symptomatic. Will forward message to provider to advise on next steps for patient. Juanita Laster, RMA

## 2021-08-26 NOTE — Progress Notes (Addendum)
Patient came into clinic today for STD testing. Tolerated injections well. Understands partners must be notified to get treated. Must refrain from all sexual encounters at least 10 days. Does not have any questions at this time. Juanita Laster, RMA

## 2021-08-26 NOTE — Telephone Encounter (Signed)
Attempted to call patient to discuss results. Not able to reach him at this time. Left voicemail requesting patient call back to discuss results. Did not disclose positive RPR in message. Juanita Laster, RMA

## 2021-08-26 NOTE — Telephone Encounter (Signed)
Please inform Mr. 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 Thanks. He was not symptomatic at his appointment.

## 2021-08-27 LAB — COMPREHENSIVE METABOLIC PANEL
AG Ratio: 1.5 (calc) (ref 1.0–2.5)
ALT: 58 U/L — ABNORMAL HIGH (ref 9–46)
AST: 28 U/L (ref 10–40)
Albumin: 5.1 g/dL (ref 3.6–5.1)
Alkaline phosphatase (APISO): 119 U/L (ref 36–130)
BUN: 18 mg/dL (ref 7–25)
CO2: 25 mmol/L (ref 20–32)
Calcium: 10 mg/dL (ref 8.6–10.3)
Chloride: 105 mmol/L (ref 98–110)
Creat: 1.14 mg/dL (ref 0.60–1.29)
Globulin: 3.3 g/dL (calc) (ref 1.9–3.7)
Glucose, Bld: 102 mg/dL — ABNORMAL HIGH (ref 65–99)
Potassium: 4.5 mmol/L (ref 3.5–5.3)
Sodium: 138 mmol/L (ref 135–146)
Total Bilirubin: 0.7 mg/dL (ref 0.2–1.2)
Total Protein: 8.4 g/dL — ABNORMAL HIGH (ref 6.1–8.1)

## 2021-08-27 LAB — RPR: RPR Ser Ql: REACTIVE — AB

## 2021-08-27 LAB — HIV-1 RNA QUANT-NO REFLEX-BLD
HIV 1 RNA Quant: <20 Copies/mL — ABNORMAL HIGH
HIV-1 RNA Quant, Log: <1.3 Log cps/mL — ABNORMAL HIGH

## 2021-08-27 LAB — FLUORESCENT TREPONEMAL AB(FTA)-IGG-BLD: Fluorescent Treponemal ABS: REACTIVE — AB

## 2021-08-27 LAB — RPR TITER: RPR Titer: 1:16 {titer} — ABNORMAL HIGH

## 2021-08-30 ENCOUNTER — Telehealth: Payer: Self-pay

## 2021-08-30 NOTE — Telephone Encounter (Signed)
Patient returned call and declined Spanish interpreter, relayed that viral load was undetectable and CD4 count 269. Relayed that kidney function and electrolytes were normal. Discussed that LFTs were elevated and Tammy Sours will monitor this.   He is asking about STD results, relayed that he was treated for syphilis on 08/26/21. Patient verbalized understanding and has no further questions.    Sandie Ano, RN

## 2021-08-30 NOTE — Telephone Encounter (Signed)
Called patient to relay results, no answer. Left HIPAA compliant voicemail requesting callback.   Izaah Westman D Maribel Luis, RN  

## 2021-08-30 NOTE — Telephone Encounter (Signed)
-----   Message from Veryl Speak, FNP sent at 08/30/2021  8:48 AM EDT ----- Please inform Mr. Ackerman that his lab work looks good with undetectable viral load and CD4 count 269. Your kidney function and electrolytes are normal. Your liver function test is slightly elevated which we will watch.

## 2021-11-01 ENCOUNTER — Other Ambulatory Visit: Payer: 59

## 2021-11-01 ENCOUNTER — Other Ambulatory Visit: Payer: Self-pay

## 2021-11-01 DIAGNOSIS — B20 Human immunodeficiency virus [HIV] disease: Secondary | ICD-10-CM

## 2021-11-02 LAB — COMPREHENSIVE METABOLIC PANEL
AG Ratio: 1.6 (calc) (ref 1.0–2.5)
ALT: 40 U/L (ref 9–46)
Albumin: 4.7 g/dL (ref 3.6–5.1)
Alkaline phosphatase (APISO): 78 U/L (ref 36–130)
BUN: 23 mg/dL (ref 7–25)
CO2: 25 mmol/L (ref 20–32)
Creat: 1.21 mg/dL (ref 0.60–1.29)
Glucose, Bld: 104 mg/dL — ABNORMAL HIGH (ref 65–99)
Sodium: 137 mmol/L (ref 135–146)

## 2021-11-02 LAB — T-HELPER CELL (CD4) - (RCID CLINIC ONLY)
CD4 % Helper T Cell: 32 % — ABNORMAL LOW (ref 33–65)
CD4 T Cell Abs: 495 /uL (ref 400–1790)

## 2021-11-04 LAB — COMPREHENSIVE METABOLIC PANEL
AST: 21 U/L (ref 10–40)
Calcium: 9.6 mg/dL (ref 8.6–10.3)
Chloride: 103 mmol/L (ref 98–110)
Globulin: 2.9 g/dL (calc) (ref 1.9–3.7)
Potassium: 4.2 mmol/L (ref 3.5–5.3)
Total Bilirubin: 0.7 mg/dL (ref 0.2–1.2)
Total Protein: 7.6 g/dL (ref 6.1–8.1)

## 2021-11-04 LAB — HIV-1 RNA QUANT-NO REFLEX-BLD
HIV 1 RNA Quant: 75 Copies/mL — ABNORMAL HIGH
HIV-1 RNA Quant, Log: 1.88 Log cps/mL — ABNORMAL HIGH

## 2021-11-15 NOTE — Progress Notes (Unsigned)
Brief Narrative   Patient ID: Ramiel Forti, male    DOB: [birth year], 41 y.o.   MRN: [id number]  Mr. Hollibaugh is a 41 y/o hispanic male diagnosed with HIV disease on 04/22/21 with risk factor of MSM.  Initial CD4 count unable to be completed secondary to laboratory error with initial viral load of 39,700.  Genotype with no significant medication resistance patterns.  No history of opportunistic infection.  HLA B5 701 negative.  Medication nave at start of treatment with Dovato  Subjective:    No chief complaint on file.   HPI:  Jeet Shough is a 41 y.o. male who with HIV disease last seen on 08/24/2021 with well-controlled virus and good adherence and tolerance to Dovato.  Viral load was undetectable with CD4 count of 269.  RPR was also positive at 1: 16 which was treated with 2,400,000 units of Bicillin.  Kidney function and electrolytes within normal ranges.  Liver function tests mildly elevated with ALT of 58.  Most recent blood work completed on 11/01/2021 with viral load of 75 and CD4 count of 495.  Kidney function, liver function, electrolytes within normal ranges.  Here today for follow-up.     No Known Allergies    Outpatient Medications Prior to Visit  Medication Sig Dispense Refill   dolutegravir-lamiVUDine (DOVATO) 50-300 MG tablet Take 1 tablet by mouth daily. 30 tablet 3   No facility-administered medications prior to visit.     Past Medical History:  Diagnosis Date   HIV infection ([place])      Past Surgical History:  Procedure Laterality Date   HERNIA REPAIR        Review of Systems  Constitutional:  Negative for appetite change, chills, fatigue, fever and unexpected weight change.  Eyes:  Negative for visual disturbance.  Respiratory:  Negative for cough, chest tightness, shortness of breath and wheezing.   Cardiovascular:  Negative for chest pain and leg swelling.  Gastrointestinal:  Negative for abdominal pain, constipation, diarrhea, nausea and  vomiting.  Genitourinary:  Negative for dysuria, flank pain, frequency, genital sores, hematuria and urgency.  Skin:  Negative for rash.  Allergic/Immunologic: Negative for immunocompromised state.  Neurological:  Negative for dizziness and headaches.      Objective:    There were no vitals taken for this visit. Nursing note and vital signs reviewed.  Physical Exam Constitutional:      General: He is not in acute distress.    Appearance: He is well-developed.  Eyes:     Conjunctiva/sclera: Conjunctivae normal.  Cardiovascular:     Rate and Rhythm: Normal rate and regular rhythm.     Heart sounds: Normal heart sounds. No murmur heard.    No friction rub. No gallop.  Pulmonary:     Effort: Pulmonary effort is normal. No respiratory distress.     Breath sounds: Normal breath sounds. No wheezing or rales.  Chest:     Chest wall: No tenderness.  Abdominal:     General: Bowel sounds are normal.     Palpations: Abdomen is soft.     Tenderness: There is no abdominal tenderness.  Musculoskeletal:     Cervical back: Neck supple.  Lymphadenopathy:     Cervical: No cervical adenopathy.  Skin:    General: Skin is warm and dry.     Findings: No rash.  Neurological:     Mental Status: He is alert and oriented to person, place, and time.  Psychiatric:  Behavior: Behavior normal.        Thought Content: Thought content normal.        Judgment: Judgment normal.         08/24/2021    9:43 AM 06/25/2021    9:36 AM 05/27/2021    8:58 AM  Depression screen PHQ 2/9  Decreased Interest 0 0 0  Down, Depressed, Hopeless 0 0 0  PHQ - 2 Score 0 0 0       Assessment & Plan:    Patient Active Problem List   Diagnosis Date Noted   Healthcare maintenance 06/25/2021   HIV disease ([place]) 05/27/2021     Problem List Items Addressed This Visit   None    I am having Danni Hally maintain his Dovato.   No orders of the defined types were placed in this  encounter.    Follow-up: No follow-ups on file.   Terri Piedra, MSN, FNP-C Nurse Practitioner [facility] for Infectious Disease [place] number: [phone]

## 2021-11-16 ENCOUNTER — Other Ambulatory Visit: Payer: Self-pay

## 2021-11-16 ENCOUNTER — Encounter: Payer: Self-pay | Admitting: Family

## 2021-11-16 ENCOUNTER — Ambulatory Visit: Payer: 59 | Admitting: Family

## 2021-11-16 VITALS — BP 132/88 | HR 73 | Temp 98.0°F | Ht 75.0 in | Wt 225.4 lb

## 2021-11-16 DIAGNOSIS — Z79899 Other long term (current) drug therapy: Secondary | ICD-10-CM

## 2021-11-16 DIAGNOSIS — Z0000 Encounter for general adult medical examination without abnormal findings: Secondary | ICD-10-CM

## 2021-11-16 DIAGNOSIS — Z113 Encounter for screening for infections with a predominantly sexual mode of transmission: Secondary | ICD-10-CM

## 2021-11-16 DIAGNOSIS — B20 Human immunodeficiency virus [HIV] disease: Secondary | ICD-10-CM | POA: Diagnosis not present

## 2021-11-16 MED ORDER — DOVATO 50-300 MG PO TABS: 1.0000 | ORAL_TABLET | Freq: Every day | ORAL | 5 refills | Status: DC

## 2021-11-16 NOTE — Assessment & Plan Note (Signed)
   Discussed importance of safe sexual practice and condom use.  Condoms and STD testing offered.  Declines vaccinations.  Routine dental care up-to-date per recommendations.

## 2021-11-16 NOTE — Patient Instructions (Addendum)
Nice to see you.  Continue to take your medication daily as prescribed.  Refills have been sent to the pharmacy.  Plan for follow up in 4 months or sooner if needed with lab work 1-2 weeks prior to appointment.   Have a great day and stay safe!  

## 2021-11-16 NOTE — Assessment & Plan Note (Signed)
Clinton Baker continues to have well-controlled virus with good adherence and tolerance to Dovato.  Reviewed lab work and discussed plan of care.  Continue current dose of Dovato.  Plan for follow-up in 4 months or sooner if needed with lab work 1 to 2 weeks prior to appointment.

## 2021-12-27 ENCOUNTER — Telehealth: Payer: Self-pay

## 2021-12-27 NOTE — Telephone Encounter (Signed)
Patient called office today stating he received a message from [facility] pharmacy stating they do no have refills on file for Clinton Baker. Patient has ten days left of medication on hand. Called pharmacy and spoke with Morrie Sheldon who states that prescription is good. Refills on file.  Pharmacy will follow up  with patient. Juanita Laster, RMA

## 2022-03-14 ENCOUNTER — Other Ambulatory Visit: Payer: Self-pay

## 2022-03-14 ENCOUNTER — Other Ambulatory Visit: Payer: 59

## 2022-03-14 DIAGNOSIS — B20 Human immunodeficiency virus [HIV] disease: Secondary | ICD-10-CM

## 2022-03-14 DIAGNOSIS — Z113 Encounter for screening for infections with a predominantly sexual mode of transmission: Secondary | ICD-10-CM

## 2022-03-15 LAB — T-HELPER CELL (CD4) - (RCID CLINIC ONLY)
CD4 % Helper T Cell: 31 % — ABNORMAL LOW (ref 33–65)
CD4 T Cell Abs: 452 /uL (ref 400–1790)

## 2022-03-16 LAB — HIV-1 RNA QUANT-NO REFLEX-BLD
HIV 1 RNA Quant: NOT DETECTED Copies/mL
HIV-1 RNA Quant, Log: NOT DETECTED Log cps/mL

## 2022-03-16 LAB — RPR: RPR Ser Ql: NONREACTIVE

## 2022-03-28 ENCOUNTER — Ambulatory Visit: Payer: Self-pay | Admitting: Family

## 2022-03-30 ENCOUNTER — Other Ambulatory Visit: Payer: Self-pay

## 2022-03-30 ENCOUNTER — Ambulatory Visit: Payer: 59 | Admitting: Family

## 2022-03-30 ENCOUNTER — Encounter: Payer: Self-pay | Admitting: Family

## 2022-03-30 VITALS — BP 133/91 | HR 77 | Temp 97.9°F | Ht 75.0 in | Wt 229.0 lb

## 2022-03-30 DIAGNOSIS — Z125 Encounter for screening for malignant neoplasm of prostate: Secondary | ICD-10-CM | POA: Insufficient documentation

## 2022-03-30 DIAGNOSIS — B20 Human immunodeficiency virus [HIV] disease: Secondary | ICD-10-CM

## 2022-03-30 DIAGNOSIS — A539 Syphilis, unspecified: Secondary | ICD-10-CM | POA: Diagnosis not present

## 2022-03-30 DIAGNOSIS — Z0000 Encounter for general adult medical examination without abnormal findings: Secondary | ICD-10-CM

## 2022-03-30 MED ORDER — DOVATO 50-300 MG PO TABS: 1.0000 | ORAL_TABLET | Freq: Every day | ORAL | 5 refills | Status: DC

## 2022-03-30 NOTE — Assessment & Plan Note (Signed)
Clinton Baker continues to have well controlled virus with good adherence and tolerance to Dovato. Reviewed lab work and discussed plan of care. Continue current dose of Dovato. Plan for follow up in 4 months or sooner if needed with lab work 1-2 weeks prior to appointment.

## 2022-03-30 NOTE — Assessment & Plan Note (Signed)
Father and an uncle have been diagnosed with prostate cancer and requesting to be screened. Reviewed prostate cancer screening recommendations and will proceed with PSA with next lab work.

## 2022-03-30 NOTE — Progress Notes (Signed)
Brief Narrative   Patient ID: Clinton Baker, male    DOB: 04-23-1980, 42 y.o.   MRN: [id number]  Mr. Clinton Baker is a 42 y/o hispanic male diagnosed with HIV disease on 04/22/21 with risk factor of MSM.  Initial CD4 count unable to be completed secondary to laboratory error with initial viral load of 39,700.  Genotype with no significant medication resistance patterns.  No history of opportunistic infection.  HLA B5 701 negative.  Medication nave at start of treatment with Dovato   Subjective:    Chief Complaint  Patient presents with   Follow-up    Interested in hep vac     HPI:  Clinton Baker is a 42 y.o. male with HIV disease last seen on 11/16/21 with well controlled virus and good adherence and tolerance to Dovato. Viral load was 75 with CD4 count 495. Most recent lab work completed on 03/14/22 with viral load that remains undetectable and CD4 count 452. RPR titer was non-reactive down from treatment at 1:16. Here today for routine follow up.   [name] has been doing well since his last office visit with no new concerns/complaints. Continues to take his Dovato with no adverse side effects or problems obtaining medication from the pharmacy. Has had discussion with his partner about his positive status that went okay. Plan to go to [place] next week to see his family with father having been diagnosed with prostate cancer. Condoms and STD testing offered. Discussed vaccines and declined for today but considering Hepatitis B and Prevnar 20.    Denies fevers, chills, night sweats, headaches, changes in vision, neck pain/stiffness, nausea, diarrhea, vomiting, lesions or rashes.    No Known Allergies    Outpatient Medications Prior to Visit  Medication Sig Dispense Refill   dolutegravir-lamiVUDine (DOVATO) 50-300 MG tablet Take 1 tablet by mouth daily. 30 tablet 5   No facility-administered medications prior to visit.     Past Medical History:  Diagnosis Date   HIV infection  ([place])      Past Surgical History:  Procedure Laterality Date   HERNIA REPAIR        Review of Systems  Constitutional:  Negative for appetite change, chills, fatigue, fever and unexpected weight change.  Eyes:  Negative for visual disturbance.  Respiratory:  Negative for cough, chest tightness, shortness of breath and wheezing.   Cardiovascular:  Negative for chest pain and leg swelling.  Gastrointestinal:  Negative for abdominal pain, constipation, diarrhea, nausea and vomiting.  Genitourinary:  Negative for dysuria, flank pain, frequency, genital sores, hematuria and urgency.  Skin:  Negative for rash.  Allergic/Immunologic: Negative for immunocompromised state.  Neurological:  Negative for dizziness and headaches.      Objective:    BP (!) 133/91   Pulse 77   Temp 97.9 F (36.6 C) (Oral)   Ht 6' 3"$  (1.905 m)   Wt 229 lb (103.9 kg)   SpO2 96%   BMI 28.62 kg/m  Nursing note and vital signs reviewed.  Physical Exam Constitutional:      General: He is not in acute distress.    Appearance: He is well-developed.  Eyes:     Conjunctiva/sclera: Conjunctivae normal.  Cardiovascular:     Rate and Rhythm: Normal rate and regular rhythm.     Heart sounds: Normal heart sounds. No murmur heard.    No friction rub. No gallop.  Pulmonary:     Effort: Pulmonary effort is normal. No respiratory distress.     Breath sounds:  Normal breath sounds. No wheezing or rales.  Chest:     Chest wall: No tenderness.  Abdominal:     General: Bowel sounds are normal.     Palpations: Abdomen is soft.     Tenderness: There is no abdominal tenderness.  Musculoskeletal:     Cervical back: Neck supple.  Lymphadenopathy:     Cervical: No cervical adenopathy.  Skin:    General: Skin is warm and dry.     Findings: No rash.  Neurological:     Mental Status: He is alert and oriented to person, place, and time.  Psychiatric:        Behavior: Behavior normal.        Thought Content: Thought  content normal.        Judgment: Judgment normal.         11/16/2021    9:29 AM 08/24/2021    9:43 AM 06/25/2021    9:36 AM 05/27/2021    8:58 AM  Depression screen PHQ 2/9  Decreased Interest 0 0 0 0  Down, Depressed, Hopeless 0 0 0 0  PHQ - 2 Score 0 0 0 0       Assessment & Plan:    Patient Active Problem List   Diagnosis Date Noted   Prostate cancer screening 03/30/2022   Healthcare maintenance 06/25/2021   HIV disease ([place]) 05/27/2021     Problem List Items Addressed This Visit       Other   HIV disease ([place]) - Primary    Clinton Baker continues to have well controlled virus with good adherence and tolerance to Dovato. Reviewed lab work and discussed plan of care. Continue current dose of Dovato. Plan for follow up in 4 months or sooner if needed with lab work 1-2 weeks prior to appointment.       Relevant Medications   dolutegravir-lamiVUDine (DOVATO) 50-300 MG tablet   Other Relevant Orders   COMPLETE METABOLIC PANEL WITH GFR   HIV-1 RNA quant-no reflex-bld   T-helper cell (CD4)- ([facility] clinic only)   Healthcare maintenance    Discussed importance of safe sexual practice and condom use. Condoms and STD testing offered.  Considering Hepatitis B and Prevnar 20 at next office visit.  Introduced anal pap smear and will consider at next office visit.       Prostate cancer screening    Father and an uncle have been diagnosed with prostate cancer and requesting to be screened. Reviewed prostate cancer screening recommendations and will proceed with PSA with next lab work.       Relevant Orders   PSA   Other Visit Diagnoses     Syphilis       Relevant Medications   dolutegravir-lamiVUDine (DOVATO) 50-300 MG tablet   Other Relevant Orders   RPR        I am having Junious Nikkel maintain his Dovato.   Meds ordered this encounter  Medications   dolutegravir-lamiVUDine (DOVATO) 50-300 MG tablet    Sig: Take 1 tablet by mouth daily.    Dispense:  30  tablet    Refill:  5    Order Specific Question:   Supervising Provider    Answer:   Carlyle Basques [[id number]]     Follow-up: Return in about 4 months (around 07/29/2022), or if symptoms worsen or fail to improve.   Terri Piedra, MSN, FNP-C Nurse Practitioner [facility] for Infectious Disease [place] number: [phone]

## 2022-03-30 NOTE — Assessment & Plan Note (Signed)
Discussed importance of safe sexual practice and condom use. Condoms and STD testing offered.  Considering Hepatitis B and Prevnar 20 at next office visit.  Introduced anal pap smear and will consider at next office visit.

## 2022-03-30 NOTE — Patient Instructions (Addendum)
Nice to see you.  Continue to take your medication daily as prescribed.  Refills have been sent to the pharmacy.  Plan for follow up in 4 months or sooner if needed with lab work 1-2 weeks prior to appointment.   Have a great day and stay safe!

## 2022-05-24 ENCOUNTER — Ambulatory Visit: Payer: 59

## 2022-07-12 ENCOUNTER — Other Ambulatory Visit: Payer: Self-pay

## 2022-07-12 ENCOUNTER — Other Ambulatory Visit: Payer: 59

## 2022-07-12 DIAGNOSIS — B20 Human immunodeficiency virus [HIV] disease: Secondary | ICD-10-CM

## 2022-07-12 DIAGNOSIS — Z125 Encounter for screening for malignant neoplasm of prostate: Secondary | ICD-10-CM

## 2022-07-12 DIAGNOSIS — A539 Syphilis, unspecified: Secondary | ICD-10-CM

## 2022-07-13 ENCOUNTER — Other Ambulatory Visit: Payer: 59

## 2022-07-13 LAB — T-HELPER CELL (CD4) - (RCID CLINIC ONLY)
CD4 % Helper T Cell: 31 % — ABNORMAL LOW (ref 33–65)
CD4 T Cell Abs: 407 /uL (ref 400–1790)

## 2022-07-13 LAB — COMPLETE METABOLIC PANEL WITH GFR
ALT: 49 U/L — ABNORMAL HIGH (ref 9–46)
Alkaline phosphatase (APISO): 101 U/L (ref 36–130)
Chloride: 103 mmol/L (ref 98–110)
Creat: 0.99 mg/dL (ref 0.60–1.29)
Glucose, Bld: 120 mg/dL — ABNORMAL HIGH (ref 65–99)
Potassium: 4.2 mmol/L (ref 3.5–5.3)
eGFR: 98 mL/min/{1.73_m2} (ref >=60)

## 2022-07-15 LAB — COMPLETE METABOLIC PANEL WITH GFR
AG Ratio: 1.8 (calc) (ref 1.0–2.5)
AST: 21 U/L (ref 10–40)
Albumin: 4.8 g/dL (ref 3.6–5.1)
BUN: 17 mg/dL (ref 7–25)
CO2: 26 mmol/L (ref 20–32)
Calcium: 10.2 mg/dL (ref 8.6–10.3)
Globulin: 2.6 g/dL (calc) (ref 1.9–3.7)
Sodium: 138 mmol/L (ref 135–146)
Total Bilirubin: 0.6 mg/dL (ref 0.2–1.2)
Total Protein: 7.4 g/dL (ref 6.1–8.1)

## 2022-07-15 LAB — PSA: PSA: 5.4 ng/mL — ABNORMAL HIGH (ref <=4.00)

## 2022-07-15 LAB — RPR: RPR Ser Ql: NONREACTIVE

## 2022-07-15 LAB — HIV-1 RNA QUANT-NO REFLEX-BLD
HIV 1 RNA Quant: <20 Copies/mL — ABNORMAL HIGH
HIV-1 RNA Quant, Log: <1.3 Log cps/mL — ABNORMAL HIGH

## 2022-07-26 ENCOUNTER — Encounter: Payer: Self-pay | Admitting: Family

## 2022-07-26 ENCOUNTER — Other Ambulatory Visit: Payer: Self-pay

## 2022-07-26 ENCOUNTER — Ambulatory Visit: Payer: 59 | Admitting: Family

## 2022-07-26 VITALS — BP 128/86 | HR 86 | Resp 16 | Ht 75.0 in | Wt 215.0 lb

## 2022-07-26 DIAGNOSIS — Z0000 Encounter for general adult medical examination without abnormal findings: Secondary | ICD-10-CM

## 2022-07-26 DIAGNOSIS — R972 Elevated prostate specific antigen [PSA]: Secondary | ICD-10-CM | POA: Diagnosis not present

## 2022-07-26 DIAGNOSIS — B20 Human immunodeficiency virus [HIV] disease: Secondary | ICD-10-CM

## 2022-07-26 MED ORDER — DOVATO 50-300 MG PO TABS: 1.0000 | ORAL_TABLET | Freq: Every day | ORAL | 5 refills | Status: DC

## 2022-07-26 NOTE — Assessment & Plan Note (Signed)
PSA elevated with most recent lab work. Will refer to Urology for additional screening and evaluation as appropriate.

## 2022-07-26 NOTE — Progress Notes (Signed)
Brief Narrative   Patient ID: Clinton Baker, male    DOB: 1980/11/16, 42 y.o.   MRN: [id number]  Clinton Baker is a 43 y/o hispanic male diagnosed with HIV disease on 04/22/21 with risk factor of MSM.  Initial CD4 count unable to be completed secondary to laboratory error with initial viral load of 39,700.  Genotype with no significant medication resistance patterns.  No history of opportunistic infection.  HLA B5 701 negative.  Medication nave at start of treatment with Dovato   Subjective:    Chief Complaint  Patient presents with   Follow-up   HIV Positive/AIDS    HPI:  Clinton Baker is a 42 y.o. male with HIV disease last seen on 03/30/2022 with well-controlled virus and good adherence and tolerance to Dovato.  Viral load was undetectable with CD4 count which 452.  Most recent lab work completed on 07/12/2022 with viral load that remains undetectable and CD4 count of 407.  Kidney function and electrolytes within normal ranges.  ALT mildly elevated at 49.  PSA elevated at 5.46 Here today for routine follow-up.  [name] has been doing okay since his last office visit and recently purchased a new home. Getting a divorce secondary to relationship changes and is grieving appropriately having good support. Continues to take Dovato as prescribed with no adverse side effects or problems obtaining medication. Condoms and STD testing offered. Declines vaccinations and anal pap.   Denies fevers, chills, night sweats, headaches, changes in vision, neck pain/stiffness, nausea, diarrhea, vomiting, lesions or rashes.  No Known Allergies    Outpatient Medications Prior to Visit  Medication Sig Dispense Refill   dolutegravir-lamiVUDine (DOVATO) 50-300 MG tablet Take 1 tablet by mouth daily. 30 tablet 5   No facility-administered medications prior to visit.     Past Medical History:  Diagnosis Date   HIV infection (HCC)      Past Surgical History:  Procedure Laterality Date   HERNIA  REPAIR        Review of Systems  Constitutional:  Negative for appetite change, chills, fatigue, fever and unexpected weight change.  Eyes:  Negative for visual disturbance.  Respiratory:  Negative for cough, chest tightness, shortness of breath and wheezing.   Cardiovascular:  Negative for chest pain and leg swelling.  Gastrointestinal:  Negative for abdominal pain, constipation, diarrhea, nausea and vomiting.  Genitourinary:  Negative for dysuria, flank pain, frequency, genital sores, hematuria and urgency.  Skin:  Negative for rash.  Allergic/Immunologic: Negative for immunocompromised state.  Neurological:  Negative for dizziness and headaches.      Objective:    BP 128/86   Pulse 86   Resp 16   Ht 6\' 3"  (1.905 m)   Wt 215 lb (97.5 kg)   BMI 26.87 kg/m  Nursing note and vital signs reviewed.  Physical Exam Constitutional:      General: He is not in acute distress.    Appearance: He is well-developed.  Eyes:     Conjunctiva/sclera: Conjunctivae normal.  Cardiovascular:     Rate and Rhythm: Normal rate and regular rhythm.     Heart sounds: Normal heart sounds. No murmur heard.    No friction rub. No gallop.  Pulmonary:     Effort: Pulmonary effort is normal. No respiratory distress.     Breath sounds: Normal breath sounds. No wheezing or rales.  Chest:     Chest wall: No tenderness.  Abdominal:     General: Bowel sounds are normal.  Palpations: Abdomen is soft.     Tenderness: There is no abdominal tenderness.  Musculoskeletal:     Cervical back: Neck supple.  Lymphadenopathy:     Cervical: No cervical adenopathy.  Skin:    General: Skin is warm and dry.     Findings: No rash.  Neurological:     Mental Status: He is alert and oriented to person, place, and time.  Psychiatric:        Behavior: Behavior normal.        Thought Content: Thought content normal.        Judgment: Judgment normal.         07/26/2022    9:22 AM 11/16/2021    9:29 AM  08/24/2021    9:43 AM 06/25/2021    9:36 AM 05/27/2021    8:58 AM  Depression screen PHQ 2/9  Decreased Interest 0 0 0 0 0  Down, Depressed, Hopeless 0 0 0 0 0  PHQ - 2 Score 0 0 0 0 0       Assessment & Plan:    Patient Active Problem List   Diagnosis Date Noted   Elevated PSA 07/26/2022   Prostate cancer screening 03/30/2022   Healthcare maintenance 06/25/2021   HIV disease (HCC) 05/27/2021     Problem List Items Addressed This Visit       Other   HIV disease (HCC)    [name] continues to have well controlled virus and with good adherence and tolerance to Dovato. Reviewed lab work and discussed U equals U and plan of care. Continue current dose of Dovato. Plan for follow up in 6 months or sooner if needed with lab work on the same day.       Relevant Medications   dolutegravir-lamiVUDine (DOVATO) 50-300 MG tablet   Healthcare maintenance    Discussed importance of safe sexual practice and condom use. Condoms and STD testing offered.  Declines anal pap  Declines vaccines.       Elevated PSA - Primary    PSA elevated with most recent lab work. Will refer to Urology for additional screening and evaluation as appropriate.       Relevant Orders   Ambulatory referral to Urology     I am having [name] maintain his Dovato.   Meds ordered this encounter  Medications   dolutegravir-lamiVUDine (DOVATO) 50-300 MG tablet    Sig: Take 1 tablet by mouth daily.    Dispense:  30 tablet    Refill:  5    Order Specific Question:   Supervising Provider    Answer:   Judyann Munson [[id number]]     Follow-up: Return in about 6 months (around 01/25/2023), or if symptoms worsen or fail to improve.   [name], MSN, FNP-C Nurse Practitioner [facility] for Infectious Disease [facility] Main number: [phone]

## 2022-07-26 NOTE — Assessment & Plan Note (Signed)
Discussed importance of safe sexual practice and condom use. Condoms and STD testing offered.  Declines anal pap  Declines vaccines.

## 2022-07-26 NOTE — Patient Instructions (Addendum)
Nice to see you.  Continue to take your medication daily as prescribed.  Refills have been sent to the pharmacy.  Referral to Urology has been placed.  Plan for follow up in 6 months or sooner if needed with lab work 1-2 weeks prior to appointment.   Have a great day and stay safe!

## 2022-07-26 NOTE — Assessment & Plan Note (Signed)
Clinton Baker continues to have well controlled virus and with good adherence and tolerance to Dovato. Reviewed lab work and discussed U equals U and plan of care. Continue current dose of Dovato. Plan for follow up in 6 months or sooner if needed with lab work on the same day.

## 2022-11-09 NOTE — Progress Notes (Signed)
The 10-year ASCVD risk score (Arnett DK, et al., 2019) is: 1.6%   Values used to calculate the score:     Age: 42 years     Sex: Male     Is Non-Hispanic African American: No     Diabetic: No     Tobacco smoker: No     Systolic Blood Pressure: 128 mmHg     Is BP treated: No     HDL Cholesterol: 40 mg/dL     Total Cholesterol: 178 mg/dL  Sandie Ano, RN

## 2022-12-26 ENCOUNTER — Other Ambulatory Visit: Payer: Self-pay

## 2022-12-26 DIAGNOSIS — Z113 Encounter for screening for infections with a predominantly sexual mode of transmission: Secondary | ICD-10-CM

## 2022-12-26 DIAGNOSIS — Z79899 Other long term (current) drug therapy: Secondary | ICD-10-CM

## 2022-12-26 DIAGNOSIS — B20 Human immunodeficiency virus [HIV] disease: Secondary | ICD-10-CM

## 2022-12-27 ENCOUNTER — Other Ambulatory Visit: Payer: 59

## 2022-12-27 ENCOUNTER — Other Ambulatory Visit: Payer: Self-pay

## 2022-12-27 DIAGNOSIS — Z79899 Other long term (current) drug therapy: Secondary | ICD-10-CM

## 2022-12-27 DIAGNOSIS — B20 Human immunodeficiency virus [HIV] disease: Secondary | ICD-10-CM

## 2022-12-27 DIAGNOSIS — Z113 Encounter for screening for infections with a predominantly sexual mode of transmission: Secondary | ICD-10-CM

## 2022-12-28 LAB — T-HELPER CELL (CD4) - (RCID CLINIC ONLY)
CD4 % Helper T Cell: 33 % (ref 33–65)
CD4 T Cell Abs: 383 /uL — ABNORMAL LOW (ref 400–1790)

## 2022-12-29 LAB — COMPLETE METABOLIC PANEL WITH GFR
AG Ratio: 2 (calc) (ref 1.0–2.5)
ALT: 43 U/L (ref 9–46)
AST: 23 U/L (ref 10–40)
Albumin: 5 g/dL (ref 3.6–5.1)
Alkaline phosphatase (APISO): 94 U/L (ref 36–130)
BUN: 16 mg/dL (ref 7–25)
CO2: 27 mmol/L (ref 20–32)
Calcium: 9.9 mg/dL (ref 8.6–10.3)
Chloride: 105 mmol/L (ref 98–110)
Creat: 1.19 mg/dL (ref 0.60–1.29)
Globulin: 2.5 g/dL (ref 1.9–3.7)
Glucose, Bld: 100 mg/dL — ABNORMAL HIGH (ref 65–99)
Potassium: 4.3 mmol/L (ref 3.5–5.3)
Sodium: 139 mmol/L (ref 135–146)
Total Bilirubin: 0.8 mg/dL (ref 0.2–1.2)
Total Protein: 7.5 g/dL (ref 6.1–8.1)
eGFR: 78 mL/min/{1.73_m2} (ref >=60)

## 2022-12-29 LAB — CBC WITH DIFFERENTIAL/PLATELET
Absolute Lymphocytes: 1224 {cells}/uL (ref 850–3900)
Absolute Monocytes: 350 {cells}/uL (ref 200–950)
Basophils Absolute: 28 {cells}/uL (ref 0–200)
Basophils Relative: 0.6 %
Eosinophils Absolute: 60 {cells}/uL (ref 15–500)
Eosinophils Relative: 1.3 %
HCT: 47.6 % (ref 38.5–50.0)
Hemoglobin: 16.5 g/dL (ref 13.2–17.1)
MCH: 31.5 pg (ref 27.0–33.0)
MCHC: 34.7 g/dL (ref 32.0–36.0)
MCV: 91 fL (ref 80.0–100.0)
MPV: 10.2 fL (ref 7.5–12.5)
Monocytes Relative: 7.6 %
Neutro Abs: 2939 {cells}/uL (ref 1500–7800)
Neutrophils Relative %: 63.9 %
Platelets: 209 10*3/uL (ref 140–400)
RBC: 5.23 10*6/uL (ref 4.20–5.80)
RDW: 12.9 % (ref 11.0–15.0)
Total Lymphocyte: 26.6 %
WBC: 4.6 10*3/uL (ref 3.8–10.8)

## 2022-12-29 LAB — LIPID PANEL
Cholesterol: 189 mg/dL (ref <200)
HDL: 35 mg/dL — ABNORMAL LOW (ref >=40)
LDL Cholesterol (Calc): 129 mg/dL — ABNORMAL HIGH
Non-HDL Cholesterol (Calc): 154 mg/dL — ABNORMAL HIGH (ref <130)
Total CHOL/HDL Ratio: 5.4 (calc) — ABNORMAL HIGH (ref <5.0)
Triglycerides: 133 mg/dL (ref <150)

## 2022-12-29 LAB — HIV-1 RNA QUANT-NO REFLEX-BLD
HIV 1 RNA Quant: <20 {copies}/mL — ABNORMAL HIGH
HIV-1 RNA Quant, Log: <1.3 {Log_copies}/mL — ABNORMAL HIGH

## 2022-12-29 LAB — RPR: RPR Ser Ql: NONREACTIVE

## 2023-01-10 ENCOUNTER — Other Ambulatory Visit: Payer: Self-pay

## 2023-01-10 ENCOUNTER — Other Ambulatory Visit (HOSPITAL_COMMUNITY)
Admission: RE | Admit: 2023-01-10 | Discharge: 2023-01-10 | Disposition: A | Discharge status: Home / Self Care | Payer: 59 | Source: Ambulatory Visit | Attending: Family | Admitting: Family

## 2023-01-10 ENCOUNTER — Encounter: Payer: Self-pay | Admitting: Family

## 2023-01-10 ENCOUNTER — Ambulatory Visit: Payer: 59 | Admitting: Family

## 2023-01-10 VITALS — BP 125/80 | HR 75 | Temp 98.2°F | Ht 75.0 in | Wt 220.0 lb

## 2023-01-10 DIAGNOSIS — B20 Human immunodeficiency virus [HIV] disease: Secondary | ICD-10-CM | POA: Diagnosis not present

## 2023-01-10 DIAGNOSIS — Z635 Disruption of family by separation and divorce: Secondary | ICD-10-CM | POA: Diagnosis not present

## 2023-01-10 DIAGNOSIS — Z0000 Encounter for general adult medical examination without abnormal findings: Secondary | ICD-10-CM

## 2023-01-10 DIAGNOSIS — Z113 Encounter for screening for infections with a predominantly sexual mode of transmission: Secondary | ICD-10-CM

## 2023-01-10 MED ORDER — DOVATO 50-300 MG PO TABS: 1.0000 | ORAL_TABLET | Freq: Every day | ORAL | 5 refills | Status: DC

## 2023-01-10 NOTE — Assessment & Plan Note (Signed)
Clinton Baker is separated from his partner and likely going through divorce. Recommend seeking legal counsel for guidance through the process. Support provided.

## 2023-01-10 NOTE — Patient Instructions (Addendum)
Nice to see you.  Continue to take your medication daily as prescribed.  Recommend you seek legal counsel.   Refills have been sent to the pharmacy.  Plan for follow up in 6 months or sooner if needed with lab work 1-2 weeks prior to appointment.   Have a great day and stay safe!

## 2023-01-10 NOTE — Assessment & Plan Note (Signed)
Discussed importance of safe sexual practice and condom use. Condoms and STD testing offered.  Vaccinations reviewed and declined.  Reviewed anal pap smear for anal cancer screening - declined today.  Routine dental care up to date.

## 2023-01-10 NOTE — Assessment & Plan Note (Signed)
Clinton Baker continues to have well controlled virus with good adherence and tolerance to Dovato.  Reviewed lab work and discussed plan of care, U equals U, and family planning. Check lab work. Continue current dose of Dovato. Plan for follow up in  6 months or sooner if needed with lab work on the same day.

## 2023-01-10 NOTE — Progress Notes (Signed)
Brief Narrative   Patient ID: Clinton Baker, male    DOB: 1980/02/17, 41 y.o.   MRN: [id number]  Mr. Mangels is a 42 y/o hispanic male diagnosed with HIV disease on 04/22/21 with risk factor of MSM.  Initial CD4 count unable to be completed secondary to laboratory error with initial viral load of 39,700.  Genotype with no significant medication resistance patterns.  No history of opportunistic infection.  HLA B5 701 negative.  Medication nave at start of treatment with Dovato   Subjective:    Chief Complaint  Patient presents with   Follow-up    B20    HPI:  Clinton Baker is a 42 y.o. male with HIV disease last seen on 07/26/2022 with well-controlled virus and good adherence and tolerance to Dovato.  Viral load was undetectable with CD4 count which 407.  Kidney function, liver function, electrolytes within normal ranges.  Glucose mildly elevated at 120.  Here today for routine follow-up.  Clinton Baker has been doing well since his last office visit continues to take Dovato as prescribed no adverse side effects or problems obtaining medication from the pharmacy.  Remains covered by [place] [employer].  Routine dental care is up-to-date per recommendations.  Working through [employer] of getting a divorce from his current partner from which they have been separated since June.  Condoms and STD testing offered.  Healthcare maintenance reviewed.  Denies feelings of being down, depressed, or hopeless  Denies fevers, chills, night sweats, headaches, changes in vision, neck pain/stiffness, nausea, diarrhea, vomiting, lesions or rashes.  Lab Results  Component Value Date   CD4TCELL 33 12/27/2022   CD4TABS 383 (L) 12/27/2022   Lab Results  Component Value Date   HIV1RNAQUANT <20 (H) 12/27/2022     No Known Allergies    Outpatient Medications Prior to Visit  Medication Sig Dispense Refill   dolutegravir-lamiVUDine (DOVATO) 50-300 MG tablet Take 1 tablet by mouth daily. 30 tablet 5    No facility-administered medications prior to visit.     Past Medical History:  Diagnosis Date   HIV infection (HCC)      Past Surgical History:  Procedure Laterality Date   HERNIA REPAIR        Review of Systems  Constitutional:  Negative for appetite change, chills, fatigue, fever and unexpected weight change.  Eyes:  Negative for visual disturbance.  Respiratory:  Negative for cough, chest tightness, shortness of breath and wheezing.   Cardiovascular:  Negative for chest pain and leg swelling.  Gastrointestinal:  Negative for abdominal pain, constipation, diarrhea, nausea and vomiting.  Genitourinary:  Negative for dysuria, flank pain, frequency, genital sores, hematuria and urgency.  Skin:  Negative for rash.  Allergic/Immunologic: Negative for immunocompromised state.  Neurological:  Negative for dizziness and headaches.      Objective:    BP 125/80   Pulse 75   Temp 98.2 F (36.8 C) (Temporal)   Ht 6\' 3"  (1.905 m)   Wt 220 lb (99.8 kg)   SpO2 95%   BMI 27.50 kg/m  Nursing note and vital signs reviewed.  Physical Exam Constitutional:      General: He is not in acute distress.    Appearance: He is well-developed.  Eyes:     Conjunctiva/sclera: Conjunctivae normal.  Cardiovascular:     Rate and Rhythm: Normal rate and regular rhythm.     Heart sounds: Normal heart sounds. No murmur heard.    No friction rub. No gallop.  Pulmonary:  Effort: Pulmonary effort is normal. No respiratory distress.     Breath sounds: Normal breath sounds. No wheezing or rales.  Chest:     Chest wall: No tenderness.  Abdominal:     General: Bowel sounds are normal.     Palpations: Abdomen is soft.     Tenderness: There is no abdominal tenderness.  Musculoskeletal:     Cervical back: Neck supple.  Lymphadenopathy:     Cervical: No cervical adenopathy.  Skin:    General: Skin is warm and dry.     Findings: No rash.  Neurological:     Mental Status: He is alert and  oriented to person, place, and time.         01/10/2023   11:03 AM 07/26/2022    9:22 AM 11/16/2021    9:29 AM 08/24/2021    9:43 AM 06/25/2021    9:36 AM  Depression screen PHQ 2/9  Decreased Interest 0 0 0 0 0  Down, Depressed, Hopeless 0 0 0 0 0  PHQ - 2 Score 0 0 0 0 0       Assessment & Plan:    Patient Active Problem List   Diagnosis Date Noted   Divorce proceedings pending 01/10/2023   Elevated PSA 07/26/2022   Prostate cancer screening 03/30/2022   Healthcare maintenance 06/25/2021   HIV disease (HCC) 05/27/2021     Problem List Items Addressed This Visit       Other   HIV disease (HCC) - Primary    Kandyce Rud continues to have well controlled virus with good adherence and tolerance to Dovato.  Reviewed lab work and discussed plan of care, U equals U, and family planning. Check lab work. Continue current dose of Dovato. Plan for follow up in  6 months or sooner if needed with lab work on the same day.       Relevant Medications   dolutegravir-lamiVUDine (DOVATO) 50-300 MG tablet   Healthcare maintenance    Discussed importance of safe sexual practice and condom use. Condoms and STD testing offered.  Vaccinations reviewed and declined.  Reviewed anal pap smear for anal cancer screening - declined today.  Routine dental care up to date.       Divorce proceedings pending    [name] is separated from his partner and likely going through divorce. Recommend seeking legal counsel for guidance through the process. Support provided.       Other Visit Diagnoses     Screening for STDs (sexually transmitted diseases)       Relevant Orders   Urine cytology ancillary only   Cytology (oral, anal, urethral) ancillary only   Cytology (oral, anal, urethral) ancillary only        I am having Zacharey Potvin maintain his Dovato.   Meds ordered this encounter  Medications   dolutegravir-lamiVUDine (DOVATO) 50-300 MG tablet    Sig: Take 1 tablet by mouth daily.     Dispense:  30 tablet    Refill:  5    Order Specific Question:   Supervising Provider    Answer:   Judyann Munson [[id number]]     Follow-up: Return in about 6 months (around 07/11/2023). or sooner if needed.    [name], MSN, FNP-C Nurse Practitioner [facility] for Infectious Disease [facility] Main number: [phone]

## 2023-01-11 LAB — CYTOLOGY, (ORAL, ANAL, URETHRAL) ANCILLARY ONLY
Chlamydia: NEGATIVE
Chlamydia: NEGATIVE
Comment: NEGATIVE
Comment: NEGATIVE
Comment: NORMAL
Comment: NORMAL
Neisseria Gonorrhea: NEGATIVE
Neisseria Gonorrhea: NEGATIVE

## 2023-01-11 LAB — URINE CYTOLOGY ANCILLARY ONLY
Chlamydia: NEGATIVE
Comment: NEGATIVE
Comment: NORMAL
Neisseria Gonorrhea: NEGATIVE

## 2023-01-16 ENCOUNTER — Telehealth: Payer: Self-pay

## 2023-01-16 NOTE — Telephone Encounter (Signed)
Patient returned call. Relayed that all STI testing returned negative. Patient verbalized understanding and has no further questions.   Sandie Ano, RN

## 2023-01-16 NOTE — Telephone Encounter (Signed)
-----   Message from Jeanine Luz sent at 01/16/2023  4:32 PM EST ----- Please inform Clinton Baker that his STD testing is all negative. Thanks!

## 2023-01-16 NOTE — Telephone Encounter (Signed)
Attempted to call patient regarding lab results. Not able to reach him at this time. Will send mychart message. Juanita Laster, RMA

## 2023-04-26 ENCOUNTER — Other Ambulatory Visit: Payer: Self-pay | Admitting: Urology

## 2023-04-26 DIAGNOSIS — R972 Elevated prostate specific antigen [PSA]: Secondary | ICD-10-CM

## 2023-05-03 ENCOUNTER — Other Ambulatory Visit: Payer: Self-pay | Admitting: Urology

## 2023-05-03 DIAGNOSIS — T1590XA Foreign body on external eye, part unspecified, unspecified eye, initial encounter: Secondary | ICD-10-CM

## 2023-05-03 DIAGNOSIS — R972 Elevated prostate specific antigen [PSA]: Secondary | ICD-10-CM

## 2023-06-08 ENCOUNTER — Encounter: Payer: Self-pay | Admitting: Urology

## 2023-06-21 ENCOUNTER — Other Ambulatory Visit

## 2023-06-21 ENCOUNTER — Ambulatory Visit
Admission: RE | Admit: 2023-06-21 | Discharge: 2023-06-21 | Disposition: A | Discharge status: Home / Self Care | Source: Ambulatory Visit | Attending: Urology | Admitting: Urology

## 2023-06-21 DIAGNOSIS — R972 Elevated prostate specific antigen [PSA]: Secondary | ICD-10-CM

## 2023-06-21 DIAGNOSIS — T1590XA Foreign body on external eye, part unspecified, unspecified eye, initial encounter: Secondary | ICD-10-CM

## 2023-06-21 MED ORDER — GADOPICLENOL 0.5 MMOL/ML IV SOLN
10.0000 mL | Freq: Once | INTRAVENOUS | Status: AC | PRN
  Administered 2023-06-21: 10 mL via INTRAVENOUS

## 2023-06-22 NOTE — Progress Notes (Signed)
 The 10-year ASCVD risk score (Arnett DK, et al., 2019) is: 2.2%   Values used to calculate the score:     Age: 43 years     Sex: Male     Is Non-Hispanic African American: No     Diabetic: No     Tobacco smoker: No     Systolic Blood Pressure: 125 mmHg     Is BP treated: No     HDL Cholesterol: 35 mg/dL     Total Cholesterol: 189 mg/dL  Arlon Bergamo, BSN, RN

## 2023-07-04 ENCOUNTER — Other Ambulatory Visit: Payer: 59

## 2023-07-04 ENCOUNTER — Other Ambulatory Visit: Payer: Self-pay

## 2023-07-04 DIAGNOSIS — B20 Human immunodeficiency virus [HIV] disease: Secondary | ICD-10-CM

## 2023-07-04 DIAGNOSIS — Z113 Encounter for screening for infections with a predominantly sexual mode of transmission: Secondary | ICD-10-CM

## 2023-07-05 LAB — C. TRACHOMATIS/N. GONORRHOEAE RNA
C. trachomatis RNA, TMA: NOT DETECTED
N. gonorrhoeae RNA, TMA: NOT DETECTED

## 2023-07-06 LAB — COMPLETE METABOLIC PANEL WITHOUT GFR
AG Ratio: 2.1 (calc) (ref 1.0–2.5)
ALT: 36 U/L (ref 9–46)
AST: 18 U/L (ref 10–40)
Albumin: 5 g/dL (ref 3.6–5.1)
Alkaline phosphatase (APISO): 85 U/L (ref 36–130)
BUN: 22 mg/dL (ref 7–25)
CO2: 25 mmol/L (ref 20–32)
Calcium: 9.8 mg/dL (ref 8.6–10.3)
Chloride: 108 mmol/L (ref 98–110)
Creat: 1.19 mg/dL (ref 0.60–1.29)
Globulin: 2.4 g/dL (ref 1.9–3.7)
Glucose, Bld: 104 mg/dL — ABNORMAL HIGH (ref 65–99)
Potassium: 4.3 mmol/L (ref 3.5–5.3)
Sodium: 141 mmol/L (ref 135–146)
Total Bilirubin: 0.6 mg/dL (ref 0.2–1.2)
Total Protein: 7.4 g/dL (ref 6.1–8.1)

## 2023-07-06 LAB — CBC WITH DIFFERENTIAL/PLATELET
Absolute Lymphocytes: 1691 {cells}/uL (ref 850–3900)
Absolute Monocytes: 426 {cells}/uL (ref 200–950)
Basophils Absolute: 20 {cells}/uL (ref 0–200)
Basophils Relative: 0.4 %
Eosinophils Absolute: 69 {cells}/uL (ref 15–500)
Eosinophils Relative: 1.4 %
HCT: 48.6 % (ref 38.5–50.0)
Hemoglobin: 16.3 g/dL (ref 13.2–17.1)
MCH: 31 pg (ref 27.0–33.0)
MCHC: 33.5 g/dL (ref 32.0–36.0)
MCV: 92.4 fL (ref 80.0–100.0)
MPV: 10.1 fL (ref 7.5–12.5)
Monocytes Relative: 8.7 %
Neutro Abs: 2695 {cells}/uL (ref 1500–7800)
Neutrophils Relative %: 55 %
Platelets: 224 10*3/uL (ref 140–400)
RBC: 5.26 10*6/uL (ref 4.20–5.80)
RDW: 12.9 % (ref 11.0–15.0)
Total Lymphocyte: 34.5 %
WBC: 4.9 10*3/uL (ref 3.8–10.8)

## 2023-07-06 LAB — T-HELPER CELLS (CD4) COUNT (NOT AT ARMC)
Absolute CD4: 586 {cells}/uL (ref 490–1740)
CD4 T Helper %: 34 % (ref 30–61)
Total lymphocyte count: 1733 {cells}/uL (ref 850–3900)

## 2023-07-06 LAB — HIV-1 RNA QUANT-NO REFLEX-BLD
HIV 1 RNA Quant: NOT DETECTED {copies}/mL
HIV-1 RNA Quant, Log: NOT DETECTED {Log_copies}/mL

## 2023-07-18 ENCOUNTER — Encounter: Payer: Self-pay | Admitting: Family

## 2023-07-18 ENCOUNTER — Other Ambulatory Visit: Payer: Self-pay

## 2023-07-18 ENCOUNTER — Ambulatory Visit: Payer: 59 | Admitting: Family

## 2023-07-18 VITALS — BP 134/84 | HR 79 | Temp 98.1°F | Ht 75.0 in | Wt 223.0 lb

## 2023-07-18 DIAGNOSIS — B20 Human immunodeficiency virus [HIV] disease: Secondary | ICD-10-CM

## 2023-07-18 DIAGNOSIS — Z79899 Other long term (current) drug therapy: Secondary | ICD-10-CM | POA: Diagnosis not present

## 2023-07-18 DIAGNOSIS — Z0000 Encounter for general adult medical examination without abnormal findings: Secondary | ICD-10-CM

## 2023-07-18 DIAGNOSIS — Z113 Encounter for screening for infections with a predominantly sexual mode of transmission: Secondary | ICD-10-CM

## 2023-07-18 DIAGNOSIS — R972 Elevated prostate specific antigen [PSA]: Secondary | ICD-10-CM | POA: Diagnosis not present

## 2023-07-18 MED ORDER — DOXYCYCLINE HYCLATE 100 MG PO TABS: ORAL_TABLET | ORAL | 1 refills | Status: AC

## 2023-07-18 MED ORDER — DOVATO 50-300 MG PO TABS: 1.0000 | ORAL_TABLET | Freq: Every day | ORAL | 5 refills | Status: DC

## 2023-07-18 NOTE — Progress Notes (Signed)
 Brief Narrative   Patient ID: Clinton Baker, male    DOB: 27-Mar-1980, 43 y.o.   MRN: [id number]  Clinton Baker is a 43 y/o hispanic male diagnosed with HIV disease on 04/22/21 with risk factor of MSM.  Initial CD4 count unable to be completed secondary to laboratory error with initial viral load of 39,700.  Genotype with no significant medication resistance patterns.  No history of opportunistic infection.  HLA B5 701 negative.  Medication nave at start of treatment with Dovato    Subjective:   Chief Complaint  Patient presents with   Follow-up    B20    HPI:  Clinton Baker is a 43 y.o. male with HIV disease last seen on 01/10/23 with well-controlled virus and good adherence and tolerance to Dovato .  Viral load was undetectable with CD4 count 383.  STD testing was negative.  Kidney function, liver function, electrolytes within normal ranges.  Most recent lab work completed on 07/04/2023 with viral load that remains undetectable and CD4 count of 586.  Kidney function, liver function, electrolytes within normal ranges.  Urine was negative for gonorrhea and chlamydia.  Here today for routine follow-up.  Clinton Baker has been doing well since his last office visit and continues to take Dovato  as prescribed with no adverse side effects or problems obtaining medication from the pharmacy.  Covered by [employer].  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  His divorce is becoming finalized tomorrow.  Following up with urology for biopsy of elevated PSA.  Housing, access to food, and transportation are stable.  Healthcare maintenance reviewed.  Condoms and site-specific STD testing offered.  Denies fevers, chills, night sweats, headaches, changes in vision, neck pain/stiffness, nausea, diarrhea, vomiting, lesions or rashes.  Lab Results  Component Value Date   CD4TCELL 34 07/04/2023   CD4TABS 383 (L) 12/27/2022   Lab Results  Component Value Date   HIV1RNAQUANT NOT DETECTED  07/04/2023     No Known Allergies    Outpatient Medications Prior to Visit  Medication Sig Dispense Refill   dolutegravir-lamiVUDine (DOVATO ) 50-300 MG tablet Take 1 tablet by mouth daily. 30 tablet 5   No facility-administered medications prior to visit.     Past Medical History:  Diagnosis Date   HIV infection (HCC)      Past Surgical History:  Procedure Laterality Date   HERNIA REPAIR      Review of Systems  Constitutional:  Negative for appetite change, chills, fatigue, fever and unexpected weight change.  Eyes:  Negative for visual disturbance.  Respiratory:  Negative for cough, chest tightness, shortness of breath and wheezing.   Cardiovascular:  Negative for chest pain and leg swelling.  Gastrointestinal:  Negative for abdominal pain, constipation, diarrhea, nausea and vomiting.  Genitourinary:  Negative for dysuria, flank pain, frequency, genital sores, hematuria and urgency.  Skin:  Negative for rash.  Allergic/Immunologic: Negative for immunocompromised state.  Neurological:  Negative for dizziness and headaches.     Objective:   BP 134/84   Pulse 79   Temp 98.1 F (36.7 C) (Oral)   Ht 6\' 3"  (1.905 m)   Wt 223 lb (101.2 kg)   SpO2 95%   BMI 27.87 kg/m  Nursing note and vital signs reviewed.  Physical Exam Constitutional:      General: He is not in acute distress.    Appearance: He is well-developed.  Eyes:     Conjunctiva/sclera: Conjunctivae normal.  Cardiovascular:     Rate and  Rhythm: Normal rate and regular rhythm.     Heart sounds: Normal heart sounds. No murmur heard.    No friction rub. No gallop.  Pulmonary:     Effort: Pulmonary effort is normal. No respiratory distress.     Breath sounds: Normal breath sounds. No wheezing or rales.  Chest:     Chest wall: No tenderness.  Abdominal:     General: Bowel sounds are normal.     Palpations: Abdomen is soft.     Tenderness: There is no abdominal tenderness.  Musculoskeletal:      Cervical back: Neck supple.  Lymphadenopathy:     Cervical: No cervical adenopathy.  Skin:    General: Skin is warm and dry.     Findings: No rash.  Neurological:     Mental Status: He is alert and oriented to person, place, and time.  Psychiatric:        Behavior: Behavior normal.        Thought Content: Thought content normal.        Judgment: Judgment normal.          07/18/2023    9:46 AM 01/10/2023   11:03 AM 07/26/2022    9:22 AM 11/16/2021    9:29 AM 08/24/2021    9:43 AM  Depression screen PHQ 2/9  Decreased Interest 0 0 0 0 0  Down, Depressed, Hopeless 0 0 0 0 0  PHQ - 2 Score 0 0 0 0 0  Altered sleeping 1      Tired, decreased energy 0      Change in appetite 0      Feeling bad or failure about yourself  0      Trouble concentrating 1      Moving slowly or fidgety/restless 0      Suicidal thoughts 0      PHQ-9 Score 2      Difficult doing work/chores Not difficult at all            07/18/2023    9:46 AM  GAD 7 : Generalized Anxiety Score  Nervous, Anxious, on Edge 0  Control/stop worrying 0  Worry too much - different things 0  Trouble relaxing 0  Restless 0  Easily annoyed or irritable 0  Afraid - awful might happen 0  Total GAD 7 Score 0     The 10-year ASCVD risk score (Arnett DK, et al., 2019) is: 2.5%   Values used to calculate the score:     Age: 11 years     Sex: Male     Is Non-Hispanic African American: No     Diabetic: No     Tobacco smoker: No     Systolic Blood Pressure: 134 mmHg     Is BP treated: No     HDL Cholesterol: 35 mg/dL     Total Cholesterol: 189 mg/dL      Assessment & Plan:    Patient Active Problem List   Diagnosis Date Noted   Divorce proceedings pending 01/10/2023   Elevated PSA 07/26/2022   Prostate cancer screening 03/30/2022   Healthcare maintenance 06/25/2021   HIV disease (HCC) 05/27/2021     Problem List Items Addressed This Visit       Other   HIV disease (HCC) - Primary   Clinton Baker continues  to have well-controlled virus with good adherence and tolerance to Dovato .  Reviewed lab work and discussed plan of care and U equals U.  Covered by [place]  healthcare with no problems obtaining medications from the pharmacy.  Social determinants of health reviewed with no interventions indicated.  Continue current dose of Dovato .  Plan for follow-up in 6 months or sooner if needed with lab work 1 to 2 weeks prior to appointment.      Relevant Medications   dolutegravir-lamiVUDine (DOVATO ) 50-300 MG tablet   Other Relevant Orders   COMPLETE METABOLIC PANEL WITHOUT GFR   HIV-1 RNA quant-no reflex-bld   T-helper cell (CD4)- ([facility] clinic only)   Healthcare maintenance   Discussed importance of safe sexual practice and condom use. Condoms and site specific STD testing offered.  Vaccinations reviewed and following counseling declined.  Routine dental care up to date.  Due for anal pap which was declined       Elevated PSA   Scheduled for biopsy with Urology.      Other Visit Diagnoses       Pharmacologic therapy       Relevant Orders   Lipid panel     Screening for STDs (sexually transmitted diseases)       Relevant Orders   RPR        I am having Clinton Baker start on doxycycline. I am also having him maintain his Dovato .   Meds ordered this encounter  Medications   dolutegravir-lamiVUDine (DOVATO ) 50-300 MG tablet    Sig: Take 1 tablet by mouth daily.    Dispense:  30 tablet    Refill:  5   doxycycline (VIBRA-TABS) 100 MG tablet    Sig: Take 2 tablets by mouth daily as needed and within 72 hours of sexual contact.    Dispense:  20 tablet    Refill:  1     Follow-up: Return in about 6 months (around 01/17/2024). or sooner if needed.    Marlan Silva, MSN, FNP-C Nurse Practitioner [facility] for Infectious Disease [facility] Main number: [phone]

## 2023-07-18 NOTE — Patient Instructions (Addendum)
 Nice to see you.  Continue to take your medication daily as prescribed.  Refills have been sent to the pharmacy.  Plan for follow up in 6 months or sooner if needed with lab work 1-2 weeks prior to appointment.   Have a great day and stay safe!

## 2023-07-18 NOTE — Assessment & Plan Note (Signed)
 Discussed importance of safe sexual practice and condom use. Condoms and site specific STD testing offered.  Vaccinations reviewed and following counseling declined.  Routine dental care up to date.  Due for anal pap which was declined

## 2023-07-18 NOTE — Assessment & Plan Note (Signed)
 Mr. Huesca continues to have well-controlled virus with good adherence and tolerance to Dovato .  Reviewed lab work and discussed plan of care and U equals U.  Covered by [place] [employer] with no problems obtaining medications from the pharmacy.  Social determinants of health reviewed with no interventions indicated.  Continue current dose of Dovato .  Plan for follow-up in 6 months or sooner if needed with lab work 1 to 2 weeks prior to appointment.

## 2023-07-18 NOTE — Assessment & Plan Note (Signed)
 Scheduled for biopsy with Urology.

## 2023-12-25 ENCOUNTER — Other Ambulatory Visit

## 2023-12-25 ENCOUNTER — Other Ambulatory Visit: Payer: Self-pay

## 2023-12-25 DIAGNOSIS — Z113 Encounter for screening for infections with a predominantly sexual mode of transmission: Secondary | ICD-10-CM

## 2023-12-25 DIAGNOSIS — Z79899 Other long term (current) drug therapy: Secondary | ICD-10-CM

## 2023-12-25 DIAGNOSIS — B20 Human immunodeficiency virus [HIV] disease: Secondary | ICD-10-CM

## 2023-12-27 LAB — LIPID PANEL
Cholesterol: 200 mg/dL — ABNORMAL HIGH (ref <200)
HDL: 33 mg/dL — ABNORMAL LOW (ref >=40)
LDL Cholesterol (Calc): 133 mg/dL — ABNORMAL HIGH
Non-HDL Cholesterol (Calc): 167 mg/dL — ABNORMAL HIGH (ref <130)
Total CHOL/HDL Ratio: 6.1 (calc) — ABNORMAL HIGH (ref <5.0)
Triglycerides: 198 mg/dL — ABNORMAL HIGH (ref <150)

## 2023-12-27 LAB — COMPLETE METABOLIC PANEL WITHOUT GFR
AG Ratio: 2.3 (calc) (ref 1.0–2.5)
ALT: 37 U/L (ref 9–46)
AST: 15 U/L (ref 10–40)
Albumin: 5 g/dL (ref 3.6–5.1)
Alkaline phosphatase (APISO): 87 U/L (ref 36–130)
BUN: 19 mg/dL (ref 7–25)
CO2: 27 mmol/L (ref 20–32)
Calcium: 9.8 mg/dL (ref 8.6–10.3)
Chloride: 105 mmol/L (ref 98–110)
Creat: 1.18 mg/dL (ref 0.60–1.29)
Globulin: 2.2 g/dL (ref 1.9–3.7)
Glucose, Bld: 138 mg/dL — ABNORMAL HIGH (ref 65–99)
Potassium: 4.1 mmol/L (ref 3.5–5.3)
Sodium: 141 mmol/L (ref 135–146)
Total Bilirubin: 0.7 mg/dL (ref 0.2–1.2)
Total Protein: 7.2 g/dL (ref 6.1–8.1)

## 2023-12-27 LAB — RPR: RPR Ser Ql: NONREACTIVE

## 2023-12-27 LAB — HIV-1 RNA QUANT-NO REFLEX-BLD
HIV 1 RNA Quant: NOT DETECTED {copies}/mL
HIV-1 RNA Quant, Log: NOT DETECTED {Log_copies}/mL

## 2023-12-27 LAB — T-HELPER CELL (CD4) - (RCID CLINIC ONLY)
CD4 % Helper T Cell: 35 % (ref 33–65)
CD4 T Cell Abs: 576 /uL (ref 400–1790)

## 2024-01-11 ENCOUNTER — Ambulatory Visit: Payer: Self-pay | Admitting: Family

## 2024-01-11 ENCOUNTER — Encounter: Payer: Self-pay | Admitting: Family

## 2024-01-11 ENCOUNTER — Other Ambulatory Visit: Payer: Self-pay

## 2024-01-11 VITALS — BP 132/84 | HR 83 | Temp 98.7°F | Resp 16 | Wt 224.4 lb

## 2024-01-11 DIAGNOSIS — Z113 Encounter for screening for infections with a predominantly sexual mode of transmission: Secondary | ICD-10-CM

## 2024-01-11 DIAGNOSIS — Z79899 Other long term (current) drug therapy: Secondary | ICD-10-CM

## 2024-01-11 DIAGNOSIS — B20 Human immunodeficiency virus [HIV] disease: Secondary | ICD-10-CM | POA: Diagnosis not present

## 2024-01-11 DIAGNOSIS — E782 Mixed hyperlipidemia: Secondary | ICD-10-CM

## 2024-01-11 DIAGNOSIS — R7301 Impaired fasting glucose: Secondary | ICD-10-CM

## 2024-01-11 DIAGNOSIS — Z0000 Encounter for general adult medical examination without abnormal findings: Secondary | ICD-10-CM

## 2024-01-11 MED ORDER — DOVATO 50-300 MG PO TABS: 1.0000 | ORAL_TABLET | Freq: Every day | ORAL | 6 refills | Status: AC

## 2024-01-11 NOTE — Patient Instructions (Signed)
 Nice to see you.  Continue to take your medication daily as prescribed.  Refills have been sent to the pharmacy.  Plan for follow up in 6 months or sooner if needed with lab work 1-2 weeks prior to appointment.   Have a great day and stay safe!

## 2024-01-11 NOTE — Progress Notes (Signed)
 Patient: Clinton Baker  DOB: 1980/12/04 MRN: [id number] PCP: Pcp, No  Referring Provider:  Brief Narrative:   Clinton Baker is a 43 y/o hispanic male diagnosed with HIV disease on 04/22/21 with risk factor of MSM.  Initial CD4 count unable to be completed secondary to laboratory error with initial viral load of 39,700.  Genotype with no significant medication resistance patterns.  No history of opportunistic infection.  HLA B5 701 negative.  Medication nave at start of treatment with Dovato   Reason for Visit: 6 month follow up   Chief Complaint  Patient presents with   Follow-up    B20       Subjective   Subjective:   Clinton Baker is a 43 year old male here for his 56-month follow up on his HIV care. Since last been seen on 07/18/2023, he reports no new changes to his health or starting new medications. Currently on Dovato  for his ART. Last viral load was undetectable and CD4 576. He reports daily adherence and no adverse effects from medication. He states having a new job where he works rotating 12 hour shifts. He states that his new schedule is not interfering with his sleeping patterns or mood. He reports stable housing, food, and transportation. He states being sexually active with multiple partners and denies penile discharge, rash or lesions, dysuria, or itchiness. He denies the use of tobacco or illegal substance use and reports social drinking. He has no concerns or complaints today and he denies fever, chills, night sweats, lesions/ rashes, headaches, N/V/D.   Review of Systems  Constitutional: Negative.   Respiratory: Negative.    Cardiovascular: Negative.   Gastrointestinal: Negative.   Genitourinary: Negative.   Musculoskeletal: Negative.   Skin: Negative.   Neurological: Negative.   Psychiatric/Behavioral: Negative.      Past Medical History:  Diagnosis Date   HIV infection (HCC)     Outpatient Medications Prior to Visit  Medication Sig Dispense Refill    dolutegravir-lamiVUDine (DOVATO ) 50-300 MG tablet Take 1 tablet by mouth daily. 30 tablet 5   doxycycline  (VIBRA -TABS) 100 MG tablet Take 2 tablets by mouth daily as needed and within 72 hours of sexual contact. (Patient not taking: Reported on 01/11/2024) 20 tablet 1   No facility-administered medications prior to visit.     No Known Allergies  Social History   Tobacco Use   Smoking status: Never    Passive exposure: Never   Smokeless tobacco: Never  Vaping Use   Vaping status: Never Used  Substance Use Topics   Alcohol use: Yes    Comment: Occasional   Drug use: Never    Family History  Problem Relation Age of Onset   Hypertension Mother    Diabetes Mother    Hypertension Father    Diabetes Father        Objective   Objective:   Vitals:   01/11/24 1048 01/11/24 1050  BP:  132/84  Pulse:  83  Resp:  16  Temp:  98.7 F (37.1 C)  TempSrc:  Oral  SpO2:  97%  Weight: 224 lb 6.4 oz (101.8 kg)    Body mass index is 28.05 kg/m.  Physical Exam Constitutional:      Appearance: Normal appearance.  Cardiovascular:     Rate and Rhythm: Normal rate and regular rhythm.  Pulmonary:     Effort: Pulmonary effort is normal.     Breath sounds: Normal breath sounds.  Musculoskeletal:        General:  Normal range of motion.  Skin:    General: Skin is warm and dry.  Neurological:     Mental Status: He is alert and oriented to person, place, and time.  Psychiatric:        Mood and Affect: Mood normal.        Behavior: Behavior normal.        Thought Content: Thought content normal.        Judgment: Judgment normal.        Assessment & Plan:   Problem List Items Addressed This Visit       Other   HIV disease (HCC) - Primary   Relevant Medications   dolutegravir-lamiVUDine (DOVATO ) 50-300 MG tablet   Healthcare maintenance   Other Visit Diagnoses       Impaired fasting glucose         Mixed hyperlipidemia           Assessment and Plan  -HIV  disease Current regiment includes daily Dovato  and reports daily adherence with no adverse effects. Kidney, liver, and electrolytes within normal range. Last viral load was undetectable and CD4 576. Social determinants of health reviewed with no interventions indicated. Will refill Dovato  today. Continue dose of Dovato . Plan for follow up in 6 months or sooner if needed with lab work 1-2 weeks prior to appointment.   -Healthcare Maintenance He reports being sexually partners with multiple partners. STI screening offered and declines at this time. Preventive care reviewed and states that he followed up with Urology and he is on yearly PSA screenings as his last one was a flase positive, no record on file found. Immunizations reviewed and he declines all vaccines at this time. Colonoscopy at 43 years of age. Anal pap discussed and offered and will consider for next visit.   -Impaired fasting glucose Glucose 138. He denies symptoms of frequent thirst and urination, fatigue, blurred vision, and numbness and tingling. Discussed the importance of increasing physical activity and diet modification. He verbalized understanding.   -Mixed hyperlipidemia  Cholesterol 200, LDL 133, and triglycerides 198. Discussed the importance of lifestyle modifications such as decreasing or avoiding fried/fast food, saturated and trans fats and increasing vegetables/leafy greens, whole grains, lean proteins such as fatty fish or poultry, and nuts/seeds. Also discussed, the importance of increasing physical activity to 5 times a week a minimum of 30 minutes a day and x2-3 times a week of strength training. He verbalized understanding.    No orders of the defined types were placed in this encounter.   Meds ordered this encounter  Medications   dolutegravir-lamiVUDine (DOVATO ) 50-300 MG tablet    Sig: Take 1 tablet by mouth daily.    Dispense:  30 tablet    Refill:  6    Supervising Provider:   LUIZ CHANNEL [[id number]]     Return in about 6 months (around 07/11/2024).   I have reviewed with the plan of care and agree with recommendations with edits made as appropriate.    Greg Rally Ouch, NP [facility] for Infectious Disease [facility] [phone]

## 2024-01-11 NOTE — Progress Notes (Deleted)
 Brief Narrative   Patient ID: Clinton Baker, male    DOB: 1980-06-03, 43 y.o.   MRN: [id number]    Subjective:   Chief Complaint  Patient presents with  . Follow-up    B20     HPI:  Clinton Baker is a 43 y.o. male with HIV disease last seen on   Up to date dntal  Denies fevers, chills, night sweats, headaches, changes in vision, neck pain/stiffness, nausea, diarrhea, vomiting, lesions or rashes.  Lab Results  Component Value Date   CD4TCELL 35 12/25/2023   CD4TABS 576 12/25/2023   Lab Results  Component Value Date   HIV1RNAQUANT NOT DETECTED 12/25/2023     No Known Allergies    Outpatient Medications Prior to Visit  Medication Sig Dispense Refill  . dolutegravir-lamiVUDine (DOVATO ) 50-300 MG tablet Take 1 tablet by mouth daily. 30 tablet 5  . doxycycline  (VIBRA -TABS) 100 MG tablet Take 2 tablets by mouth daily as needed and within 72 hours of sexual contact. (Patient not taking: Reported on 01/11/2024) 20 tablet 1   No facility-administered medications prior to visit.     Past Medical History:  Diagnosis Date  . HIV infection [facility])      Past Surgical History:  Procedure Laterality Date  . HERNIA REPAIR          Review of Systems   Objective:   BP 132/84   Pulse 83   Temp 98.7 F (37.1 C) (Oral)   Resp 16   Wt 224 lb 6.4 oz (101.8 kg)   SpO2 97%   BMI 28.05 kg/m  Nursing note and vital signs reviewed.  Physical Exam       07/18/2023    9:46 AM 01/10/2023   11:03 AM 07/26/2022    9:22 AM 11/16/2021    9:29 AM 08/24/2021    9:43 AM  Depression screen PHQ 2/9  Decreased Interest 0 0 0 0 0  Down, Depressed, Hopeless 0 0 0 0 0  PHQ - 2 Score 0 0 0 0 0  Altered sleeping 1      Tired, decreased energy 0      Change in appetite 0      Feeling bad or failure about yourself  0      Trouble concentrating 1      Moving slowly or fidgety/restless 0      Suicidal thoughts 0      PHQ-9 Score 2       Difficult doing work/chores  Not difficult at all         Data saved with a previous flowsheet row definition        07/18/2023    9:46 AM  GAD 7 : Generalized Anxiety Score  Nervous, Anxious, on Edge 0  Control/stop worrying 0  Worry too much - different things 0  Trouble relaxing 0  Restless 0  Easily annoyed or irritable 0  Afraid - awful might happen 0  Total GAD 7 Score 0     The 10-year ASCVD risk score (Arnett DK, et al., 2019) is: 3.2%   Values used to calculate the score:     Age: 61 years     Clincally relevant sex: Male     Is Non-Hispanic African American: No     Diabetic: No     Tobacco smoker: No     Systolic Blood Pressure: 132 mmHg     Is BP treated: No     HDL Cholesterol: 33 mg/dL  Total Cholesterol: 200 mg/dL      Assessment & Plan:    Patient Active Problem List   Diagnosis Date Noted  . Divorce proceedings pending 01/10/2023  . Elevated PSA 07/26/2022  . Prostate cancer screening 03/30/2022  . Healthcare maintenance 06/25/2021  . HIV disease (HCC) 05/27/2021     Problem List Items Addressed This Visit       Other   HIV disease (HCC) - Primary   Relevant Medications   dolutegravir-lamiVUDine (DOVATO ) 50-300 MG tablet   Healthcare maintenance   Other Visit Diagnoses       Impaired fasting glucose         Mixed hyperlipidemia            I am having Clinton Baker maintain his doxycycline  and Dovato .   Meds ordered this encounter  Medications  . dolutegravir-lamiVUDine (DOVATO ) 50-300 MG tablet    Sig: Take 1 tablet by mouth daily.    Dispense:  30 tablet    Refill:  6    Supervising Provider:   LUIZ Baker [[id number]]     Follow-up: Return in about 6 months (around 07/11/2024). or sooner if needed.    Clinton July, MSN, FNP-C Nurse Practitioner [facility] for Infectious Disease [facility] Main number: [phone]

## 2024-07-11 ENCOUNTER — Other Ambulatory Visit

## 2024-07-25 ENCOUNTER — Ambulatory Visit: Admitting: Family
